# Patient Record
Sex: Male | Born: 1970 | Race: White | Hispanic: No | Marital: Married | Smoking: Never smoker
Health system: Southern US, Community
[De-identification: ages and names within clinical notes are randomized; demographics above are authoritative.]

## PROBLEM LIST (undated history)

## (undated) DIAGNOSIS — K5792 Diverticulitis of intestine, part unspecified, without perforation or abscess without bleeding: Secondary | ICD-10-CM

## (undated) DIAGNOSIS — I10 Essential (primary) hypertension: Secondary | ICD-10-CM

## (undated) DIAGNOSIS — R55 Syncope and collapse: Secondary | ICD-10-CM

## (undated) DIAGNOSIS — J189 Pneumonia, unspecified organism: Secondary | ICD-10-CM

## (undated) HISTORY — PX: TOE SURGERY: SHX1073

## (undated) HISTORY — PX: NASAL SINUS SURGERY: SHX719

---

## 1999-02-04 ENCOUNTER — Ambulatory Visit: Admission: RE | Admit: 1999-02-04 | Discharge: 1999-02-04 | Payer: Self-pay | Admitting: Pulmonary Disease

## 1999-03-05 ENCOUNTER — Ambulatory Visit (HOSPITAL_COMMUNITY): Admission: RE | Admit: 1999-03-05 | Discharge: 1999-03-05 | Payer: Self-pay | Admitting: Pulmonary Disease

## 1999-03-05 ENCOUNTER — Encounter: Payer: Self-pay | Admitting: Pulmonary Disease

## 1999-06-10 ENCOUNTER — Other Ambulatory Visit: Admission: RE | Admit: 1999-06-10 | Discharge: 1999-06-10 | Payer: Self-pay | Admitting: *Deleted

## 1999-06-10 ENCOUNTER — Encounter (INDEPENDENT_AMBULATORY_CARE_PROVIDER_SITE_OTHER): Payer: Self-pay

## 2004-04-08 ENCOUNTER — Ambulatory Visit: Payer: Self-pay | Admitting: Family Medicine

## 2004-05-11 ENCOUNTER — Encounter: Admission: RE | Admit: 2004-05-11 | Discharge: 2004-05-11 | Payer: Self-pay | Admitting: Neurology

## 2004-06-17 ENCOUNTER — Ambulatory Visit: Payer: Self-pay | Admitting: Cardiology

## 2004-06-26 ENCOUNTER — Ambulatory Visit: Payer: Self-pay

## 2004-07-17 ENCOUNTER — Ambulatory Visit: Payer: Self-pay | Admitting: Family Medicine

## 2004-09-16 ENCOUNTER — Ambulatory Visit: Payer: Self-pay | Admitting: Family Medicine

## 2005-02-27 ENCOUNTER — Ambulatory Visit: Payer: Self-pay | Admitting: Family Medicine

## 2006-01-29 ENCOUNTER — Ambulatory Visit: Payer: Self-pay | Admitting: Family Medicine

## 2006-05-07 ENCOUNTER — Ambulatory Visit: Payer: Self-pay | Admitting: Family Medicine

## 2006-12-29 ENCOUNTER — Ambulatory Visit: Payer: Self-pay | Admitting: Family Medicine

## 2006-12-29 DIAGNOSIS — J069 Acute upper respiratory infection, unspecified: Secondary | ICD-10-CM | POA: Insufficient documentation

## 2009-04-09 ENCOUNTER — Telehealth: Payer: Self-pay | Admitting: Family Medicine

## 2009-04-09 ENCOUNTER — Ambulatory Visit: Payer: Self-pay | Admitting: Family Medicine

## 2009-04-09 DIAGNOSIS — M25579 Pain in unspecified ankle and joints of unspecified foot: Secondary | ICD-10-CM | POA: Insufficient documentation

## 2009-10-18 ENCOUNTER — Encounter (INDEPENDENT_AMBULATORY_CARE_PROVIDER_SITE_OTHER): Payer: Self-pay | Admitting: *Deleted

## 2010-02-21 ENCOUNTER — Emergency Department (HOSPITAL_COMMUNITY)
Admission: EM | Admit: 2010-02-21 | Discharge: 2010-02-21 | Payer: Self-pay | Source: Home / Self Care | Admitting: Family Medicine

## 2010-04-15 ENCOUNTER — Emergency Department (HOSPITAL_COMMUNITY)
Admission: EM | Admit: 2010-04-15 | Discharge: 2010-04-15 | Payer: Self-pay | Source: Home / Self Care | Admitting: Family Medicine

## 2010-04-15 LAB — POCT URINALYSIS DIPSTICK
Bilirubin Urine: NEGATIVE
Ketones, ur: NEGATIVE mg/dL
Nitrite: NEGATIVE
Protein, ur: NEGATIVE mg/dL
Specific Gravity, Urine: 1.02 (ref 1.005–1.030)
Urine Glucose, Fasting: NEGATIVE mg/dL
Urobilinogen, UA: 1 mg/dL (ref 0.0–1.0)
pH: 7 (ref 5.0–8.0)

## 2010-04-16 NOTE — Letter (Signed)
Summary: Nadara Eaton letter  [place] [facility]  [address] [place], [place] [address]   Phone: [phone]  Fax: [phone]       10/18/2009 MRN: [id number]  NAEEM QUILLIN [address] [place], [place]  [address]  Dear Mr. [facility] [place] and [place] announce the retirement of Arta Silence, M.D., from full-time practice at the [facility] office effective September 13, 2009 and his plans of returning part-time.  It is important to Dr. Hetty Ely and to our practice that you understand that [facility] has seven physicians in our office for your health care needs.  We will continue to offer the same exceptional care that you have today.    Dr. Hetty Ely has spoken to many of you about his plans for retirement and returning part-time in the fall.   We will continue to work with you through the transition to schedule appointments for you in the office and meet the high standards that [place] is committed to.   Again, it is with great pleasure that we share the news that Dr. Hetty Ely will return to [facility] in October of 2011 with a reduced schedule.    If you have any questions, or would like to request an appointment with one of our physicians, please call us at [phone] and press the option for Scheduling an appointment.  We take pleasure in providing you with excellent patient care and look forward to seeing you at your next office visit.  Our [facility] Physicians are:  Tillman Abide, M.D. Laurita Quint, M.D. Roxy Manns, M.D. Kerby Nora, M.D. Hannah Beat, M.D. Ruthe Mannan, M.D. We proudly welcomed Raechel Ache, M.D. and Eustaquio Boyden, M.D. to the practice in July/August 2011.  Sincerely,  [facility]

## 2010-04-16 NOTE — Letter (Signed)
Summary: Out of Work  [employer] at [facility]  [address] [place], [place] [address]   Phone: [phone]  Fax: [phone]    April 09, 2009   Employee:  PAVLE WILER    To Whom It May Concern:   For Medical reasons, please excuse the above named employee from work for the following dates:  Start:   04/09/2009  End:   04/11/2009  If you need additional information, please feel free to contact our office.         Sincerely,    Ruthe Mannan MD

## 2010-04-16 NOTE — Progress Notes (Signed)
Summary: pain both ankles  Phone Note Call from Patient Call back at [phone]   Caller: Patient Call For: Dr. Dayton Martes Summary of Call: Pt having sharp pain in both ankles for a while but works with [employer] and over weekend pain worsened to a 7 on a scale of 1 - 10. Pt needs to be seen and also needs note for work. Pt to see Dr. Dayton Martes this morning at 10:45am. Initial call taken by: Lewanda Rife LPN,  April 09, 2009 8:17 AM

## 2010-04-16 NOTE — Assessment & Plan Note (Signed)
Summary: PAIN BOTH ANKLES/RI   Vital Signs:  Patient profile:   40 year old male Weight:      196.50 pounds Temp:     97.8 degrees F oral Pulse rate:   76 / minute Pulse rhythm:   regular BP sitting:   130 / 86  (left arm) Cuff size:   regular  Vitals Entered By: Delilah Shan CMA Duncan Dull) (April 09, 2009 10:38 AM) CC: Pain in ankles.   History of Present Illness: 40 yo male with pain in bilateral ankles.  Started in November at work. Works as Presenter, broadcasting, on his feet a lot.  Notices that after a long day of work, both of his ankles start aching on the lateral side, sometimes so bad it hurts medially as well. No pain in his feet. No injury. No swelling or redness of joints. Feels over last several weeks, pain is getting worse, almost feels like they are unstable. Has not sprained either ankle in past.  Wears [place] shoes at work.    Current Medications (verified): 1)  Tussin .... Otc Prn 2)  Ibuprofen .... As Needed Fever 3)  Meloxicam 15 Mg Tabs (Meloxicam) .... One By Mouth Daily  Allergies (verified): No Known Drug Allergies  Review of Systems      See HPI General:  Denies fever. MS:  Complains of joint pain; denies joint redness, joint swelling, and loss of strength.  Physical Exam  General:  Well-developed,well-nourished,in no acute distress; alert,appropriate and cooperative throughout examination   Foot/Ankle Exam  Ankle Exam:    Right:    Inspection:  Normal    Palpation:  Normal    Stability:  unstable    Tenderness:  no    Swelling:  no    Erythema:  no    FROM    Left:    Inspection:  Normal    Palpation:  Normal    Stability:  unstable    Tenderness:  no    Swelling:  no    Erythema:  no    FROM   Impression & Recommendations:  Problem # 1:  ANKLE PAIN, BILATERAL (ICD-719.47) Assessment New Likely multifactorial, from working long hours on cement floors and streets.  Likely needs more supportive foot wear that can stablize  ankles.  Will refer to sports med for further work up and treatment.  Given Meloxicam for pain/inflammation. Orders: Sports Medicine (Sports Med)  Complete Medication List: 1)  Tussin  .... Otc prn 2)  Ibuprofen  .... As needed fever 3)  Meloxicam 15 Mg Tabs (Meloxicam) .... One by mouth daily  Patient Instructions: 1)  Great to see you. 2)  Try elevating your feet. 3)  I have sent Meloxicam into your pharmacy. 4)  Please stop by to see Shirlee Limerick on your way out to set up your referral. Prescriptions: MELOXICAM 15 MG TABS (MELOXICAM) one by mouth daily  #30 x 0   Entered and Authorized by:   Ruthe Mannan MD   Signed by:   Ruthe Mannan MD on 04/09/2009   Method used:   Electronically to        [facility]  [address] (retail)       [address]       [place], [place]  [address]       Ph: [phone] or [phone]       Fax: [phone]   RxID:   [id number]   Prior Medications (reviewed today): None Current Allergies (reviewed today): No known  allergies

## 2010-04-17 LAB — URINE CULTURE
Colony Count: NO GROWTH
Culture  Setup Time: 201201310355
Culture: NO GROWTH

## 2011-04-20 ENCOUNTER — Emergency Department: Payer: Self-pay | Admitting: Emergency Medicine

## 2012-08-20 ENCOUNTER — Ambulatory Visit: Payer: Self-pay | Admitting: Podiatry

## 2012-08-23 LAB — PATHOLOGY REPORT

## 2013-03-17 DIAGNOSIS — J189 Pneumonia, unspecified organism: Secondary | ICD-10-CM

## 2013-03-17 HISTORY — DX: Pneumonia, unspecified organism: J18.9

## 2013-03-31 ENCOUNTER — Emergency Department (HOSPITAL_COMMUNITY): Payer: BC Managed Care – PPO

## 2013-03-31 ENCOUNTER — Observation Stay (HOSPITAL_COMMUNITY)
Admission: EM | Admit: 2013-03-31 | Discharge: 2013-04-01 | Disposition: A | Discharge status: Home / Self Care | Payer: BC Managed Care – PPO | Attending: Internal Medicine | Admitting: Internal Medicine

## 2013-03-31 ENCOUNTER — Observation Stay (HOSPITAL_COMMUNITY): Payer: BC Managed Care – PPO

## 2013-03-31 ENCOUNTER — Encounter (HOSPITAL_COMMUNITY): Payer: Self-pay | Admitting: Emergency Medicine

## 2013-03-31 DIAGNOSIS — R000 Tachycardia, unspecified: Secondary | ICD-10-CM

## 2013-03-31 DIAGNOSIS — J189 Pneumonia, unspecified organism: Principal | ICD-10-CM | POA: Insufficient documentation

## 2013-03-31 DIAGNOSIS — R9389 Abnormal findings on diagnostic imaging of other specified body structures: Secondary | ICD-10-CM

## 2013-03-31 DIAGNOSIS — I10 Essential (primary) hypertension: Secondary | ICD-10-CM | POA: Insufficient documentation

## 2013-03-31 DIAGNOSIS — I498 Other specified cardiac arrhythmias: Secondary | ICD-10-CM | POA: Insufficient documentation

## 2013-03-31 DIAGNOSIS — J069 Acute upper respiratory infection, unspecified: Secondary | ICD-10-CM

## 2013-03-31 DIAGNOSIS — R918 Other nonspecific abnormal finding of lung field: Secondary | ICD-10-CM | POA: Insufficient documentation

## 2013-03-31 DIAGNOSIS — M25579 Pain in unspecified ankle and joints of unspecified foot: Secondary | ICD-10-CM

## 2013-03-31 HISTORY — DX: Diverticulitis of intestine, part unspecified, without perforation or abscess without bleeding: K57.92

## 2013-03-31 HISTORY — DX: Essential (primary) hypertension: I10

## 2013-03-31 LAB — CBC WITH DIFFERENTIAL/PLATELET
BASOS ABS: 0 10*3/uL (ref 0.0–0.1)
Basophils Relative: 0 % (ref 0–1)
EOS PCT: 1 % (ref 0–5)
Eosinophils Absolute: 0.1 10*3/uL (ref 0.0–0.7)
HCT: 49 % (ref 39.0–52.0)
Hemoglobin: 16.7 g/dL (ref 13.0–17.0)
LYMPHS PCT: 24 % (ref 12–46)
Lymphs Abs: 1.8 10*3/uL (ref 0.7–4.0)
MCH: 31 pg (ref 26.0–34.0)
MCHC: 34.1 g/dL (ref 30.0–36.0)
MCV: 90.9 fL (ref 78.0–100.0)
Monocytes Absolute: 0.6 10*3/uL (ref 0.1–1.0)
Monocytes Relative: 8 % (ref 3–12)
NEUTROS ABS: 4.9 10*3/uL (ref 1.7–7.7)
NEUTROS PCT: 66 % (ref 43–77)
PLATELETS: 404 10*3/uL — AB (ref 150–400)
RBC: 5.39 MIL/uL (ref 4.22–5.81)
RDW: 13.4 % (ref 11.5–15.5)
WBC: 7.4 10*3/uL (ref 4.0–10.5)

## 2013-03-31 LAB — CBC
HCT: 47.7 % (ref 39.0–52.0)
Hemoglobin: 16.2 g/dL (ref 13.0–17.0)
MCH: 31.2 pg (ref 26.0–34.0)
MCHC: 34 g/dL (ref 30.0–36.0)
MCV: 91.9 fL (ref 78.0–100.0)
PLATELETS: 406 10*3/uL — AB (ref 150–400)
RBC: 5.19 MIL/uL (ref 4.22–5.81)
RDW: 13.5 % (ref 11.5–15.5)
WBC: 7.6 10*3/uL (ref 4.0–10.5)

## 2013-03-31 LAB — URINALYSIS, ROUTINE W REFLEX MICROSCOPIC
Bilirubin Urine: NEGATIVE
Glucose, UA: NEGATIVE mg/dL
Hgb urine dipstick: NEGATIVE
KETONES UR: NEGATIVE mg/dL
Leukocytes, UA: NEGATIVE
NITRITE: NEGATIVE
Protein, ur: NEGATIVE mg/dL
Specific Gravity, Urine: 1.01 (ref 1.005–1.030)
Urobilinogen, UA: 0.2 mg/dL (ref 0.0–1.0)
pH: 7 (ref 5.0–8.0)

## 2013-03-31 LAB — CREATININE, SERUM
Creatinine, Ser: 0.86 mg/dL (ref 0.50–1.35)
GFR calc non Af Amer: >90 mL/min (ref >90)

## 2013-03-31 LAB — COMPREHENSIVE METABOLIC PANEL
ALBUMIN: 3.9 g/dL (ref 3.5–5.2)
ALK PHOS: 183 U/L — AB (ref 39–117)
ALT: 25 U/L (ref 0–53)
AST: 17 U/L (ref 0–37)
BILIRUBIN TOTAL: 0.6 mg/dL (ref 0.3–1.2)
BUN: 10 mg/dL (ref 6–23)
CHLORIDE: 96 meq/L (ref 96–112)
CO2: 27 meq/L (ref 19–32)
CREATININE: 0.77 mg/dL (ref 0.50–1.35)
Calcium: 9.9 mg/dL (ref 8.4–10.5)
GFR calc Af Amer: >90 mL/min (ref >90)
GFR calc non Af Amer: >90 mL/min (ref >90)
Glucose, Bld: 119 mg/dL — ABNORMAL HIGH (ref 70–99)
Potassium: 4.3 mEq/L (ref 3.7–5.3)
Sodium: 137 mEq/L (ref 137–147)
Total Protein: 8.1 g/dL (ref 6.0–8.3)

## 2013-03-31 LAB — INFLUENZA PANEL BY PCR (TYPE A & B)
H1N1FLUPCR: NOT DETECTED
INFLBPCR: NEGATIVE
Influenza A By PCR: NEGATIVE

## 2013-03-31 LAB — TROPONIN I
Troponin I: <0.3 ng/mL (ref <0.30)
Troponin I: <0.3 ng/mL (ref <0.30)

## 2013-03-31 LAB — STREP PNEUMONIAE URINARY ANTIGEN: Strep Pneumo Urinary Antigen: NEGATIVE

## 2013-03-31 LAB — D-DIMER, QUANTITATIVE (NOT AT ARMC): D-Dimer, Quant: 0.46 ug/mL-FEU (ref 0.00–0.48)

## 2013-03-31 MED ORDER — MENTHOL 3 MG MT LOZG
1.0000 | LOZENGE | OROMUCOSAL | Status: DC | PRN
  Administered 2013-03-31: 3 mg via ORAL
  Filled 2013-03-31: qty 9

## 2013-03-31 MED ORDER — SODIUM CHLORIDE 0.9 % IV SOLN
INTRAVENOUS | Status: DC
  Administered 2013-03-31: 16:00:00 via INTRAVENOUS

## 2013-03-31 MED ORDER — GUAIFENESIN-CODEINE 100-10 MG/5ML PO SOLN: 5.0000 mL | Freq: Four times a day (QID) | ORAL | Status: DC | PRN

## 2013-03-31 MED ORDER — ENOXAPARIN SODIUM 40 MG/0.4ML ~~LOC~~ SOLN
40.0000 mg | SUBCUTANEOUS | Status: DC
  Administered 2013-03-31: 40 mg via SUBCUTANEOUS
  Filled 2013-03-31 (×2): qty 0.4

## 2013-03-31 MED ORDER — IOHEXOL 300 MG/ML  SOLN
100.0000 mL | Freq: Once | INTRAMUSCULAR | Status: AC | PRN
  Administered 2013-03-31: 100 mL via INTRAVENOUS

## 2013-03-31 MED ORDER — CEFEPIME HCL 1 G IJ SOLR
1.0000 g | Freq: Three times a day (TID) | INTRAMUSCULAR | Status: DC
  Administered 2013-03-31 – 2013-04-01 (×3): 1 g via INTRAVENOUS
  Filled 2013-03-31 (×5): qty 1

## 2013-03-31 MED ORDER — SODIUM CHLORIDE 0.9 % IV SOLN: INTRAVENOUS | Status: DC

## 2013-03-31 MED ORDER — VANCOMYCIN HCL IN DEXTROSE 1-5 GM/200ML-% IV SOLN
1000.0000 mg | Freq: Three times a day (TID) | INTRAVENOUS | Status: DC
  Administered 2013-03-31 – 2013-04-01 (×2): 1000 mg via INTRAVENOUS
  Filled 2013-03-31 (×3): qty 200

## 2013-03-31 MED ORDER — SODIUM CHLORIDE 0.9 % IV BOLUS (SEPSIS)
1000.0000 mL | Freq: Once | INTRAVENOUS | Status: AC
Start: 2013-03-31 — End: 2013-03-31
  Administered 2013-03-31: 1000 mL via INTRAVENOUS

## 2013-03-31 MED ORDER — BENZONATATE 100 MG PO CAPS
100.0000 mg | ORAL_CAPSULE | Freq: Three times a day (TID) | ORAL | Status: DC
  Administered 2013-03-31: 100 mg via ORAL
  Filled 2013-03-31 (×4): qty 1

## 2013-03-31 NOTE — H&P (Signed)
History and Physical       Hospital Admission Note Date: 03/31/2013  Patient name: Duane Rodriguez Medical record number: [id number] Date of birth: 1971/01/14 Age: 43 y.o. Gender: male  PCP: Ria Bush, MD    Chief Complaint:  Palpitations  HPI:  Patient is a 43 year old male with past medical history of hypertension, diverticulitis in 11/14 presented to the ER due to palpitations and feeling very fatigued. History was obtained from the patient who reported that he had episode of acute diverticulitis in November 2014 and he was on antibiotics, that resolved. Subsequently, in the first week of January 2015, he had fevers with chills, cough with productive phlegm, myalgias and arthralgias. He was placed on Augmentin by his PCP and also received steroid shot. Patient has not received Flu shot this year. He also reports that flu test was not checked outpatient. He also states that he has not received any Tamiflu outpatient. Patient also had chest tightness and feeling of palpitation and fatigue. Patient got his pulse was 134 this morning, BP 128/92. For EDP, on standing the patient's heart rate was in 130s to 140s, sinus rhythm He denies any smoking history, IV drug use, any recent long-distance travels. Chest x-ray showed irregular, linear and somewhat spiculated opacity in the central right upper lung, possibly underlying infectious/inflammatory process scarring or neoplasm.  Review of Systems:  Constitutional: Denies fever, chills, diaphoresis, poor appetite and fatigue.  HEENT: Denies photophobia, eye pain, redness, hearing loss, ear pain, congestion, sore throat, rhinorrhea, sneezing, mouth sores, trouble swallowing, neck pain, neck stiffness and tinnitus.   Respiratory please see history of present illness Cardiovascular: + Chest pain, palpitations. Denies leg swelling.  Gastrointestinal: Denies nausea, vomiting, abdominal pain,  diarrhea, constipation, blood in stool and abdominal distention.  Genitourinary: Denies dysuria, urgency, frequency, hematuria, flank pain and difficulty urinating.  Musculoskeletal: Denies myalgias, back pain, joint swelling, arthralgias and gait problem.  Skin: Denies pallor, rash and wound.  Neurological: Feeling fatigued and generalized weakness, Denies dizziness, seizures, syncope, weakness, light-headedness, numbness and headaches.  Hematological: Denies adenopathy. Easy bruising, personal or family bleeding history  Psychiatric/Behavioral: Denies suicidal ideation, mood changes, confusion, nervousness, sleep disturbance and agitation  Past Medical History: Past Medical History  Diagnosis Date  . Hypertension   . Diverticulitis    Past Surgical History  Procedure Laterality Date  . Nasal sinus surgery    . Toe surgery      Medications: Prior to Admission medications   Medication Sig Start Date End Date Taking? Authorizing Provider  losartan-hydrochlorothiazide (HYZAAR) 50-12.5 MG per tablet Take 1 tablet by mouth daily.   Yes Historical Provider, MD    Allergies:   Allergies  Allergen Reactions  . Ibuprofen Other (See Comments)    Oral ulcers    Social History:  reports that he has never smoked. He has never used smokeless tobacco. He reports that he drinks alcohol. He reports that he does not use illicit drugs.  Family History: History reviewed. No pertinent family history.  Physical Exam: Blood pressure 124/97, pulse 75, temperature 97.5 F (36.4 C), temperature source Oral, resp. rate 19, SpO2 93.00%. General: Alert, awake, oriented x3, in no acute distress. HEENT: normocephalic, atraumatic, anicteric sclera, pink conjunctiva, pupils equal and reactive to light and accomodation, oropharynx clear Neck: supple, no masses or lymphadenopathy, no goiter, no bruits  Heart: Regular rate and rhythm, without murmurs, rubs or gallops. Lungs: Clear to auscultation  bilaterally, no wheezing, rales or rhonchi. Abdomen: Soft, nontender, nondistended, positive bowel  sounds, no masses. Extremities: No clubbing, cyanosis or edema with positive pedal pulses. Neuro: Grossly intact, no focal neurological deficits, strength 5/5 upper and lower extremities bilaterally Psych: alert and oriented x 3, normal mood and affect Skin: no rashes or lesions, warm and dry   LABS on Admission:  Basic Metabolic Panel:  Recent Labs Lab 03/31/13 0940  NA 137  K 4.3  CL 96  CO2 27  GLUCOSE 119*  BUN 10  CREATININE 0.77  CALCIUM 9.9   Liver Function Tests:  Recent Labs Lab 03/31/13 0940  AST 17  ALT 25  ALKPHOS 183*  BILITOT 0.6  PROT 8.1  ALBUMIN 3.9   No results found for this basename: LIPASE, AMYLASE,  in the last 168 hours No results found for this basename: AMMONIA,  in the last 168 hours CBC:  Recent Labs Lab 03/31/13 0940  WBC 7.4  NEUTROABS 4.9  HGB 16.7  HCT 49.0  MCV 90.9  PLT 404*   Cardiac Enzymes:  Recent Labs Lab 03/31/13 0940  TROPONINI <0.30   BNP: No components found with this basename: POCBNP,  CBG: No results found for this basename: GLUCAP,  in the last 168 hours   Radiological Exams on Admission: Dg Chest 2 View  03/31/2013   CLINICAL DATA:  Tachycardia, hypertension  EXAM: CHEST  2 VIEW  COMPARISON:  None.  FINDINGS: Irregular linear but somewhat spiculated opacity in the right upper lung is best seen on the frontal view. The lungs are otherwise clear. The cardiac and mediastinal contours are within normal limits. No pneumothorax or pleural effusion. No acute osseous abnormality. .  IMPRESSION: Irregular, linear and somewhat spiculated opacity in the central right upper lung may reflect an underlying infectious/ inflammatory process, a focus of residual pleural parenchymal scarring, or potentially a neoplasm.  Recommend further evaluation with CT scan of the chest.   Electronically Signed   By: Jacqulynn Cadet M.D.    On: 03/31/2013 09:55    Assessment/Plan Principal Problem:   HCAP (healthcare-associated pneumonia) vs RUL opacity - Patient will be admitted for observation, will obtain CT chest with contrast for further workup of the spiculated opacity. Patient does not have any smoking history. - D-dimer is normal, will check flu PCR, urine legionella antigen, urine strep antigen - For now placed him on HCAP Protocol as he has done 2 courses of antibiotics in the last 2 months, continue IV fluid hydration. Narrow or dc antibiotics according to the CT chest results.  Active Problems:   Tachycardia with orthostasis, palpitations: Likely due to sinusitis and upper respiratory infection in the last 2 weeks - Will treat as above, continue IV fluid hydration, check orthostatic vitals in a.m. - EKG was reviewed, normal sinus rhythm. Patient will be admitted to telemetry to rule out any arrhythmias. - D-dimer is normal, check TSH, 2-D echocardiogram, serial troponins    Abnormal chest x-ray: As #1  Recent diverticulitis: Currently stable  DVT prophylaxis: Lovenox  CODE STATUS: Full code  Family Communication: Admission, patients condition and plan of care including tests being ordered have been discussed with the patient and his wife who indicates understanding and agree with the plan and Code Status   Further plan will depend as patient's clinical course evolves and further radiologic and laboratory data become available.   Time Spent on Admission: 1 hour  Isiaih Hollenbach M.D. [facility] 03/31/2013, 2:36 PM Pager: [phone]  If 7PM-7AM, please contact night-coverage [email or web address] Password [email or web address]

## 2013-03-31 NOTE — ED Provider Notes (Signed)
CSN: [id number]     Arrival date & time 03/31/13  0830 History   First MD Initiated Contact with Patient 03/31/13 [phone]     Chief Complaint  Patient presents with  . Tachycardia  . Hypertension   (Consider location/radiation/quality/duration/timing/severity/associated sxs/prior Treatment) HPI Comments: 43 yo wm with a PMH of diverticulitis in past cc of tachycardia and HTN.   Pt has been experiencing fast HR at work and his BP has been elevated.  Pt was seen on Tuesday by PCP and started on BP medicaiton (Hyzaar).  Pt has been taking Rx for 3 days. Pt was called by PCP and told his LFTs are elevated.    Pt also reports increased fatigue.  Pt believes he had the "flu" in first week of January.  He was on antibiotic, cough syrup, and steroids, and tylenol.    No recent travel.  No h/ o heart or lung disease.    No smoking, IDU.  Minimal caffeine.  No suppliments.  Age: 39 BP elevated Cholesterol unknown No DM Fit, not overweight Mother has HTN, but no heart disease reported  No travel, no hemoptysis, no h/o blood clots, no recent surgeries.    Patient is a 43 y.o. male presenting with hypertension. The history is provided by the patient and the spouse.  Hypertension This is a recurrent problem. The current episode started 3 to 5 hours ago. The problem occurs constantly. The problem has been gradually improving. Associated symptoms include chest pain. Pertinent negatives include no abdominal pain, no headaches and no shortness of breath. Associated symptoms comments: Chest pressure with palpitations. Nothing aggravates the symptoms. Nothing relieves the symptoms. He has tried nothing for the symptoms.    Past Medical History  Diagnosis Date  . Hypertension   . Diverticulitis    Past Surgical History  Procedure Laterality Date  . Nasal sinus surgery    . Toe surgery     Family History  Problem Relation Age of Onset  . Hypertension Mother   . Cancer - Lung Father    History   Substance Use Topics  . Smoking status: Never Smoker   . Smokeless tobacco: Never Used  . Alcohol Use: Yes     Comment: rarely    Review of Systems  Constitutional: Negative.   HENT: Negative.   Eyes: Negative.   Respiratory: Negative for cough, choking, chest tightness, shortness of breath, wheezing and stridor.   Cardiovascular: Positive for chest pain.  Gastrointestinal: Negative for nausea, abdominal pain, diarrhea, constipation and anal bleeding.  Endocrine: Negative.   Genitourinary: Negative.   Musculoskeletal: Negative.   Skin: Negative.   Allergic/Immunologic: Negative.   Neurological: Negative.  Negative for seizures, light-headedness, numbness and headaches.  All other systems reviewed and are negative.    Allergies  Ibuprofen  Home Medications   No current outpatient prescriptions on file. BP 115/91  Pulse 87  Temp(Src) 98.5 F (36.9 C) (Oral)  Resp 20  Ht 5\' 11"  (1.803 m)  Wt 205 lb (92.987 kg)  BMI 28.60 kg/m2  SpO2 98% Physical Exam  Constitutional: He is oriented to person, place, and time. He appears well-developed and well-nourished. No distress.  HENT:  Head: Normocephalic and atraumatic.  Nose: Nose normal.  Mouth/Throat: Oropharynx is clear and moist.  Eyes: Conjunctivae are normal. Right eye exhibits no discharge. Left eye exhibits no discharge.  Neck: Normal range of motion. Neck supple.  Cardiovascular: Regular rhythm and normal pulses.  Tachycardia present.  Exam reveals no distant heart  sounds and no friction rub.   No murmur heard. Pulmonary/Chest: Effort normal and breath sounds normal. No respiratory distress. He has no wheezes. He has no rales. He exhibits no tenderness.  Abdominal: Soft. Bowel sounds are normal. He exhibits no distension and no mass. There is no tenderness. There is no rebound and no guarding.  Musculoskeletal: Normal range of motion. He exhibits no edema and no tenderness.  Neurological: He is alert and oriented to  person, place, and time. He has normal reflexes.  Skin: Skin is warm and dry. He is not diaphoretic.    ED Course  Procedures (including critical care time) Labs Review Labs Reviewed  CBC WITH DIFFERENTIAL - Abnormal; Notable for the following:    Platelets 404 (*)    All other components within normal limits  COMPREHENSIVE METABOLIC PANEL - Abnormal; Notable for the following:    Glucose, Bld 119 (*)    Alkaline Phosphatase 183 (*)    All other components within normal limits  CULTURE, BLOOD (ROUTINE X 2)  CULTURE, BLOOD (ROUTINE X 2)  CULTURE, EXPECTORATED SPUTUM-ASSESSMENT  URINE CULTURE  TROPONIN I  D-DIMER, QUANTITATIVE  TROPONIN I  INFLUENZA PANEL BY PCR (TYPE A & B, H1N1)  TSH  HIV ANTIBODY (ROUTINE TESTING)  LEGIONELLA ANTIGEN, URINE  STREP PNEUMONIAE URINARY ANTIGEN  BASIC METABOLIC PANEL  CBC  CBC  CREATININE, SERUM  URINALYSIS, ROUTINE W REFLEX MICROSCOPIC  TROPONIN I  TROPONIN I  TROPONIN I  HEMOGLOBIN A1C   Imaging Review   EKG Interpretation    Date/Time:  Thursday March 31 2013 08:31:35 EST Ventricular Rate:  123 PR Interval:  148 QRS Duration: 98 QT Interval:  318 QTC Calculation: 455 R Axis:   -68 Text Interpretation:  Sinus tachycardia Possible Left atrial enlargement Left axis deviation Pulmonary disease pattern Abnormal ECG Confirmed by Chalon Zobrist  MD, Saniyah Mondesir ([id number]) on 03/31/2013 8:51:29 AM           Results for orders placed during the hospital encounter of 03/31/13  CBC WITH DIFFERENTIAL      Result Value Range   WBC 7.4  4.0 - 10.5 K/uL   RBC 5.39  4.22 - 5.81 MIL/uL   Hemoglobin 16.7  13.0 - 17.0 g/dL   HCT 49.0  39.0 - 52.0 %   MCV 90.9  78.0 - 100.0 fL   MCH 31.0  26.0 - 34.0 pg   MCHC 34.1  30.0 - 36.0 g/dL   RDW 13.4  11.5 - 15.5 %   Platelets 404 (*) 150 - 400 K/uL   Neutrophils Relative % 66  43 - 77 %   Neutro Abs 4.9  1.7 - 7.7 K/uL   Lymphocytes Relative 24  12 - 46 %   Lymphs Abs 1.8  0.7 - 4.0 K/uL   Monocytes  Relative 8  3 - 12 %   Monocytes Absolute 0.6  0.1 - 1.0 K/uL   Eosinophils Relative 1  0 - 5 %   Eosinophils Absolute 0.1  0.0 - 0.7 K/uL   Basophils Relative 0  0 - 1 %   Basophils Absolute 0.0  0.0 - 0.1 K/uL  COMPREHENSIVE METABOLIC PANEL      Result Value Range   Sodium 137  137 - 147 mEq/L   Potassium 4.3  3.7 - 5.3 mEq/L   Chloride 96  96 - 112 mEq/L   CO2 27  19 - 32 mEq/L   Glucose, Bld 119 (*) 70 - 99 mg/dL   BUN  10  6 - 23 mg/dL   Creatinine, Ser 7.820.77  0.50 - 1.35 mg/dL   Calcium 9.9  8.4 - 95.610.5 mg/dL   Total Protein 8.1  6.0 - 8.3 g/dL   Albumin 3.9  3.5 - 5.2 g/dL   AST 17  0 - 37 U/L   ALT 25  0 - 53 U/L   Alkaline Phosphatase 183 (*) 39 - 117 U/L   Total Bilirubin 0.6  0.3 - 1.2 mg/dL   GFR calc non Af Amer >90  >90 mL/min   GFR calc Af Amer >90  >90 mL/min  TROPONIN I      Result Value Range   Troponin I <0.30  <0.30 ng/mL  D-DIMER, QUANTITATIVE      Result Value Range   D-Dimer, Quant 0.46  0.00 - 0.48 ug/mL-FEU  TROPONIN I      Result Value Range   Troponin I <0.30  <0.30 ng/mL     MDM   1. Tachycardia   2. Abnormal chest x-ray   3. Acute upper respiratory infections of unspecified site   4. HCAP (healthcare-associated pneumonia)    43 year old white male presents emergency department with chief complaint of tachycardia and hypertension and chest tightness  ER course: Initial EKG reveals tachycardia 120s with nonspecific changes. Troponin negative. D-dimer negative. Chest x-ray concerning for a spiculated opacity in the right upper lung which may represent infectious inflammatory process however neoplasm is not out of the question.  Based on abnormal chest x-ray and persistent tachycardia on standing plan for consult to internal medicine for admission.  I spoke to Dr. Isidoro Donningai who will admit.  Second troponin pending.  The patient appears reasonably stabilized for admission considering the current resources, flow, and capabilities available in the ED at  this time, and I doubt any other [facility] further screening and/or treatment in the ED prior to admission. Patient and his wife agree with plan.     Darlys Galesavid Alitzel Cookson, MD 03/31/13 [phone]

## 2013-03-31 NOTE — ED Notes (Signed)
Pt reports to the ED for eval of tachycardia that he noted as soon as he awoke. Pt reports that his BP and HR have been running high lately. Pt reports that when he took his pulse this morning it was 134 bpm. Pt reports that he has been monitored for hypertension and this week he was placed on a new hypertensive medication. Pt reports his BP this morning was 128/92. Pts BP at this time 118/98 and HR 100s-110s. Heart beat regular and sinus tach. Pt reports some chest tightness this morning, heart fluttering, SOB, and lightheadedness. Denies any N/V, fevers, chills, diaphoresis, or dizziness. but denies any of these symptpms at this time. Pt also reports increased lethargy x several months ever since he had a exacerbation of his diverticulitis and the flu. Pt A&Ox4, resp e/u, and skin warm and dry.

## 2013-03-31 NOTE — Progress Notes (Signed)
ANTIBIOTIC CONSULT NOTE - INITIAL  Pharmacy Consult for vancomycin and cefepime Indication: Healthcare Associated Pneumonia  Allergies  Allergen Reactions  . Ibuprofen Other (See Comments)    Oral ulcers    Patient Measurements: Height: 5\' 11"  (180.3 cm) Weight: 205 lb (92.987 kg) IBW/kg (Calculated) : 75.3  Vital Signs: Temp: 97.5 F (36.4 C) (01/15 0831) Temp src: Oral (01/15 0831) BP: 110/93 mmHg (01/15 1447) Pulse Rate: 127 (01/15 1447) Intake/Output from previous day:   Intake/Output from this shift:    Labs:  Recent Labs  03/31/13 0940  WBC 7.4  HGB 16.7  PLT 404*  CREATININE 0.77   Estimated Creatinine Clearance: 138.8 ml/min (by C-G formula based on Cr of 0.77). No results found for this basename: VANCOTROUGH, VANCOPEAK, VANCORANDOM, GENTTROUGH, GENTPEAK, GENTRANDOM, TOBRATROUGH, TOBRAPEAK, TOBRARND, AMIKACINPEAK, AMIKACINTROU, AMIKACIN,  in the last 72 hours   Microbiology: No results found for this or any previous visit (from the past 720 hour(s)).  Medical History: Past Medical History  Diagnosis Date  . Hypertension   . Diverticulitis     Medications:  Unknown antibiotic 11/14 Augmentin first week of January  Assessment: Patient is a 43 year old male with past medical history of hypertension, diverticulitis in 11/14 presented to the ER due to palpitations and feeling very fatigued. CXR concerning for underlying infection or pneumonia. Given that patient has recently beein hospitalized and receiving antibiotics will start empiric coverage for HCAP. No fevers noted, all labs appear normal.   Goal of Therapy:  Vancomycin trough level 15-20 mcg/ml  Plan:  Measure antibiotic drug levels at steady state Follow up culture results Vancomycin 1g IV q 8 hours  Erin Hearing PharmD., BCPS Clinical Pharmacist Pager [phone] 03/31/2013 3:08 PM

## 2013-04-01 LAB — CBC
HCT: 47.6 % (ref 39.0–52.0)
HEMOGLOBIN: 16.1 g/dL (ref 13.0–17.0)
MCH: 31.2 pg (ref 26.0–34.0)
MCHC: 33.8 g/dL (ref 30.0–36.0)
MCV: 92.2 fL (ref 78.0–100.0)
Platelets: 397 10*3/uL (ref 150–400)
RBC: 5.16 MIL/uL (ref 4.22–5.81)
RDW: 13.5 % (ref 11.5–15.5)
WBC: 7.3 10*3/uL (ref 4.0–10.5)

## 2013-04-01 LAB — BASIC METABOLIC PANEL
BUN: 12 mg/dL (ref 6–23)
CO2: 27 mEq/L (ref 19–32)
Calcium: 9.2 mg/dL (ref 8.4–10.5)
Chloride: 98 mEq/L (ref 96–112)
Creatinine, Ser: 0.9 mg/dL (ref 0.50–1.35)
Glucose, Bld: 117 mg/dL — ABNORMAL HIGH (ref 70–99)
POTASSIUM: 4.3 meq/L (ref 3.7–5.3)
SODIUM: 138 meq/L (ref 137–147)

## 2013-04-01 LAB — HEMOGLOBIN A1C
Hgb A1c MFr Bld: 5.4 % (ref <5.7)
Mean Plasma Glucose: 108 mg/dL (ref <117)

## 2013-04-01 LAB — TSH: TSH: 2.156 u[IU]/mL (ref 0.350–4.500)

## 2013-04-01 LAB — HIV ANTIBODY (ROUTINE TESTING W REFLEX): HIV: NONREACTIVE

## 2013-04-01 LAB — TROPONIN I
Troponin I: <0.3 ng/mL (ref <0.30)
Troponin I: <0.3 ng/mL (ref <0.30)

## 2013-04-01 MED ORDER — LEVOFLOXACIN 750 MG PO TABS: 750.0000 mg | ORAL_TABLET | Freq: Every day | ORAL | Status: DC

## 2013-04-01 NOTE — Progress Notes (Signed)
Discussed discharge instructions and medications with pt. Pt showed no barriers to discharge. IV removed. Tele removed. Pt awaiting ride home with wife. Assessment unchanged from morning. Work note sent home with pt.

## 2013-04-01 NOTE — Discharge Summary (Signed)
Physician Discharge Summary  TIMMY DWINELL [id number] DOB: 11/30/1970 DOA: 03/31/2013  PCP: Ria Bush, MD  Admit date: 03/31/2013 Discharge date: 04/01/2013  Time spent: 45 minutes  Recommendations for Outpatient Follow-up:  -Will be discharged home today. -Advised to follow up with PCP in 2 weeks. -Will need a repeat CXR in 4-6 weeks to ensure clearance of his PNA.   Discharge Diagnoses:  Principal Problem:   CAP (community acquired pneumonia) Active Problems:   Tachycardia   Abnormal chest x-ray   Discharge Condition: Stable and improved  Filed Weights   03/31/13 1500 03/31/13 2044  Weight: 92.987 kg (205 lb) 92.987 kg (205 lb)    History of present illness:  Patient is a 43 year old male with past medical history of hypertension, diverticulitis in 11/14 presented to the ER due to palpitations and feeling very fatigued. History was obtained from the patient who reported that he had episode of acute diverticulitis in November 2014 and he was on antibiotics, that resolved. Subsequently, in the first week of January 2015, he had fevers with chills, cough with productive phlegm, myalgias and arthralgias. He was placed on Augmentin by his PCP and also received steroid shot. Patient has not received Flu shot this year. He also reports that flu test was not checked outpatient. He also states that he has not received any Tamiflu outpatient. Patient also had chest tightness and feeling of palpitation and fatigue. Patient got his pulse was 134 this morning, BP 128/92. For EDP, on standing the patient's heart rate was in 130s to 140s, sinus rhythm  He denies any smoking history, IV drug use, any recent long-distance travels.  Chest x-ray showed irregular, linear and somewhat spiculated opacity in the central right upper lung, possibly underlying infectious/inflammatory process scarring or neoplasm. Hospitalist admission was requested.   Hospital Course:   CAP -Feels much  better today. -Has no oxygen requirements. -Is not nauseous and feels he would have no issues taking PO antibiotics. -Will DC home on levaquin for 14 days. -Ct Chest performed given concerns for a ?mass on CXR; findings were most consistent with a multifocal infectious process.  Sinus Tachycardia -Improved. -Related to acute infection.  Procedures:  None   Consultations:  None  Discharge Instructions      Discharge Orders   Future Orders Complete By Expires   Diet - low sodium heart healthy  As directed    Discontinue IV  As directed    Increase activity slowly  As directed        Medication List         levofloxacin 750 MG tablet  Commonly known as:  LEVAQUIN  Take 1 tablet (750 mg total) by mouth daily. For 14 days     losartan-hydrochlorothiazide 50-12.5 MG per tablet  Commonly known as:  HYZAAR  Take 1 tablet by mouth daily.       Allergies  Allergen Reactions  . Ibuprofen Other (See Comments)    Oral ulcers   Follow-up Information   Follow up with Ria Bush, MD. Schedule an appointment as soon as possible for a visit in 2 weeks.   Specialty:  Family Medicine   Contact information:   [address] [place] [address] [phone]        The results of significant diagnostics from this hospitalization (including imaging, microbiology, ancillary and laboratory) are listed below for reference.    Significant Diagnostic Studies: Dg Chest 2 View  03/31/2013   CLINICAL DATA:  Tachycardia, hypertension  EXAM: CHEST  2 VIEW  COMPARISON:  None.  FINDINGS: Irregular linear but somewhat spiculated opacity in the right upper lung is best seen on the frontal view. The lungs are otherwise clear. The cardiac and mediastinal contours are within normal limits. No pneumothorax or pleural effusion. No acute osseous abnormality. .  IMPRESSION: Irregular, linear and somewhat spiculated opacity in the central right upper lung may reflect an underlying  infectious/ inflammatory process, a focus of residual pleural parenchymal scarring, or potentially a neoplasm.  Recommend further evaluation with CT scan of the chest.   Electronically Signed   By: Jacqulynn Cadet M.D.   On: 03/31/2013 09:55   Ct Chest W Contrast  03/31/2013   CLINICAL DATA:  Right upper lung abnormality on chest x-ray  EXAM: CT CHEST WITH CONTRAST  TECHNIQUE: Multidetector CT imaging of the chest was performed during intravenous contrast administration.  CONTRAST:  131mL OMNIPAQUE IOHEXOL 300 MG/ML  SOLN  COMPARISON:  Chest x-ray earlier today at 9:50 a.m.  FINDINGS: Mediastinum: Unremarkable CT appearance of the thyroid gland. No suspicious mediastinal or hilar adenopathy. No soft tissue mediastinal mass. The thoracic esophagus is unremarkable.  Heart/Vascular: Heart is within normal limits for size. No pericardial effusion. No a central filling defect to suggest acute PE. Normal-appearing aorta.  Lungs/Pleura: No pleural effusion. Focal consolidation in the right upper lung with adjacent micro and macronodular satellite opacities. The largest individual nodular opacity measures 12 mm. Additional numerous small nodular opacities are identified in a peribronchovascular distribution throughout the posterior left upper lobe and superior segment of the left lower lobe. There are some superimposed areas of tree-in-bud micro nodularity. No pleural effusion or pneumothorax.  Bones/Soft Tissues: No acute fracture or aggressive appearing lytic or blastic osseous lesion.  Upper Abdomen: Unremarkable imaged upper abdomen.  IMPRESSION: CT findings are most consistent with a multifocal infectious/inflammatory process involving the bilateral upper lobes and superior segment of the left lower lobe. Differential considerations should include atypical infections. Consider sputum cultures versus bronchoscopy. Recommend follow-up imaging to resolution following appropriate course of antimicrobial therapy.    Electronically Signed   By: Jacqulynn Cadet M.D.   On: 03/31/2013 16:01    Microbiology: No results found for this or any previous visit (from the past 240 hour(s)).   Labs: Basic Metabolic Panel:  Recent Labs Lab 03/31/13 0940 03/31/13 1930 04/01/13 0418  NA 137  --  138  K 4.3  --  4.3  CL 96  --  98  CO2 27  --  27  GLUCOSE 119*  --  117*  BUN 10  --  12  CREATININE 0.77 0.86 0.90  CALCIUM 9.9  --  9.2   Liver Function Tests:  Recent Labs Lab 03/31/13 0940  AST 17  ALT 25  ALKPHOS 183*  BILITOT 0.6  PROT 8.1  ALBUMIN 3.9   No results found for this basename: LIPASE, AMYLASE,  in the last 168 hours No results found for this basename: AMMONIA,  in the last 168 hours CBC:  Recent Labs Lab 03/31/13 0940 03/31/13 1930 04/01/13 0418  WBC 7.4 7.6 7.3  NEUTROABS 4.9  --   --   HGB 16.7 16.2 16.1  HCT 49.0 47.7 47.6  MCV 90.9 91.9 92.2  PLT 404* 406* 397   Cardiac Enzymes:  Recent Labs Lab 03/31/13 0940 03/31/13 1400 03/31/13 1930 03/31/13 2340 04/01/13 0418  TROPONINI <0.30 <0.30 <0.30 <0.30 <0.30   BNP: BNP (last 3 results) No results found for this  basename: PROBNP,  in the last 8760 hours CBG: No results found for this basename: GLUCAP,  in the last 168 hours     Signed:  Lelon Frohlich  [facility] Pager: [phone] 04/01/2013, 1:59 PM

## 2013-04-02 LAB — LEGIONELLA ANTIGEN, URINE: Legionella Antigen, Urine: NEGATIVE

## 2013-04-02 LAB — URINE CULTURE
Colony Count: NO GROWTH
Culture: NO GROWTH

## 2013-04-03 ENCOUNTER — Telehealth: Payer: Self-pay | Admitting: Family Medicine

## 2013-04-03 NOTE — Telephone Encounter (Signed)
Pt admitted for CAP 1/15-16/2015. plz call for transitional care phone call and schedule f/u appt within 2 wks. Discharged on levaquin 14d course.

## 2013-04-05 NOTE — Telephone Encounter (Signed)
Pt says he is no longer a patient of Dr. Danise Mina.  He sees Dr.Hedgerick in [place] and has made a follow up appt with him.  Info adjusted in pt's chart.

## 2013-04-07 LAB — CULTURE, BLOOD (ROUTINE X 2)
Culture: NO GROWTH
Culture: NO GROWTH

## 2013-04-14 ENCOUNTER — Ambulatory Visit: Payer: BC Managed Care – PPO | Admitting: Family Medicine

## 2014-07-07 NOTE — Op Note (Signed)
PATIENT NAME:  Duane Rodriguez, Duane Rodriguez MR#:  [id number] DATE OF BIRTH:  04-16-1970  DATE OF PROCEDURE:  08/20/2012  PREOPERATIVE DIAGNOSIS: Hallux limitus with exostosis right great toe joint.   POSTOPERATIVE DIAGNOSIS: Hallux limitus with exostosis right great toe joint.  PROCEDURE: Cheilectomy, right great toe joint.   SURGEON: Sharlotte Alamo, DPM.   ANESTHESIA: Local MAC.   HEMOSTASIS: Pneumatic tourniquet, right ankle, 250 mmHg.   ESTIMATED BLOOD LOSS: Minimal.   PATHOLOGY: Bone and cartilage right great toe joint.   MATERIALS: None.   COMPLICATIONS: None apparent.   OPERATIVE INDICATIONS: This is a 44 year old male with a chief complaint of pain in his right great toe joint that has been progressive over the last few years. Conservative care has proved unsuccessful and he elects for surgical cleaning up of the joint.   OPERATIVE PROCEDURE: The patient was taken to the Operating Room and placed on the table in the supine position. Following satisfactory sedation, the right foot was anesthetized with 10 mL of 0.5% Sensorcaine plain around the first metatarsal. A pneumatic tourniquet was applied at the level of the right ankle and the foot was prepped and draped in the usual sterile fashion. The foot was exsanguinated and the tourniquet inflated to 250 mmHg.   Attention was then directed to the dorsal aspect of the right great toe joint area where an approximately 4 cm linear incision was made, coursing proximal to distal, centered over the joint. The incision was deepened via sharp and blunt dissection with care taken to retract all neurovascular structures. At the level of the joint, a linear capsulotomy was performed and the capsular and periosteal tissues reflected off of the head of the first metatarsal and base of the proximal phalanx. There was noted to be significant degenerative spurring dorsally off the head of the first metatarsal as well as the base of the proximal phalanx. This was  removed using a pneumatic saw as well as a rongeur. After the spurs were removed, the joint was inspected and there was noted to be a significant defect along the lateral head of the first metatarsal approximately 35 to 40% of the joint surface. Also noted in the lateral aspect of the joint was a joint mouse calcification approximately 3 mm x 2 mm. This was removed. Next, using a wire pass drill, multiple holes were made in the exposed bone on the lateral surface of the head of the metatarsal as well as a couple of holes on the lateral aspect of the base of the proximal phalanx where there was also some eroded cartilage. There was noted to be better alignment with the toe in a more rectus and medial position. Attention was then directed to the medial aspect of the joint where a capsulorrhaphy was performed to tighten the medial aspect of the joint allowing the toe to sit in a better rectus alignment. A lateral capsulotomy intra-articular was also performed. Intraoperative FluoroScan views revealed good reduction of the bony spurs and alignment of the toe. The wound was flushed with copious amounts of sterile saline and closed using 3-0 Vicryl figure of eight sutures for capsulorrhaphy followed by 4-0 Vicryl running suture for all of the remaining layers from periosteal and capsular closure to deep and superficial and skin closure. Tincture of benzoin and Steri-Strips were applied followed by Xeroform and a sterile bandage. The tourniquet was released and blood flow noted to return immediately to the right foot and digits through five. The patient tolerated the procedure and anesthesia  well and was transported to the post anesthesia care unit with vital signs stable and in good condition.   ____________________________ Sharlotte Alamo, DPM tc:rw D: 08/20/2012 11:54:02 ET T: 08/20/2012 12:55:50 ET JOB#: [id number]  cc: Sharlotte Alamo, DPM, <Dictator> Shifa Brisbon DPM ELECTRONICALLY SIGNED 08/26/2012 10:40

## 2017-12-15 DIAGNOSIS — R55 Syncope and collapse: Secondary | ICD-10-CM

## 2017-12-15 HISTORY — DX: Syncope and collapse: R55

## 2018-01-13 ENCOUNTER — Other Ambulatory Visit: Payer: Self-pay | Admitting: Sports Medicine

## 2018-01-13 DIAGNOSIS — M25561 Pain in right knee: Principal | ICD-10-CM

## 2018-01-13 DIAGNOSIS — G8929 Other chronic pain: Secondary | ICD-10-CM

## 2018-01-31 ENCOUNTER — Ambulatory Visit
Admission: RE | Admit: 2018-01-31 | Discharge: 2018-01-31 | Disposition: A | Discharge status: Home / Self Care | Payer: BLUE CROSS/BLUE SHIELD | Source: Ambulatory Visit | Attending: Sports Medicine | Admitting: Sports Medicine

## 2018-01-31 DIAGNOSIS — S83241A Other tear of medial meniscus, current injury, right knee, initial encounter: Secondary | ICD-10-CM | POA: Insufficient documentation

## 2018-01-31 DIAGNOSIS — M25561 Pain in right knee: Secondary | ICD-10-CM

## 2018-01-31 DIAGNOSIS — G8929 Other chronic pain: Secondary | ICD-10-CM

## 2018-03-18 ENCOUNTER — Encounter
Admission: RE | Disposition: A | Discharge status: Home / Self Care | Payer: Self-pay | Source: Home / Self Care | Attending: Orthopedic Surgery

## 2018-03-18 ENCOUNTER — Ambulatory Visit
Admission: RE | Admit: 2018-03-18 | Discharge: 2018-03-18 | Disposition: A | Discharge status: Home / Self Care | Payer: BLUE CROSS/BLUE SHIELD | Attending: Orthopedic Surgery | Admitting: Orthopedic Surgery

## 2018-03-18 ENCOUNTER — Ambulatory Visit: Payer: BLUE CROSS/BLUE SHIELD | Admitting: Anesthesiology

## 2018-03-18 DIAGNOSIS — S83241A Other tear of medial meniscus, current injury, right knee, initial encounter: Secondary | ICD-10-CM | POA: Insufficient documentation

## 2018-03-18 DIAGNOSIS — X58XXXA Exposure to other specified factors, initial encounter: Secondary | ICD-10-CM | POA: Insufficient documentation

## 2018-03-18 DIAGNOSIS — Z79899 Other long term (current) drug therapy: Secondary | ICD-10-CM | POA: Insufficient documentation

## 2018-03-18 HISTORY — PX: KNEE ARTHROSCOPY WITH MEDIAL MENISECTOMY: SHX5651

## 2018-03-18 HISTORY — DX: Syncope and collapse: R55

## 2018-03-18 HISTORY — DX: Pneumonia, unspecified organism: J18.9

## 2018-03-18 SURGERY — ARTHROSCOPY, KNEE, WITH MEDIAL MENISCECTOMY
Anesthesia: General | Site: Knee | Laterality: Right

## 2018-03-18 MED ORDER — GLYCOPYRROLATE 0.2 MG/ML IJ SOLN
INTRAMUSCULAR | Status: DC | PRN
  Administered 2018-03-18: 0.1 mg via INTRAVENOUS

## 2018-03-18 MED ORDER — HYDROCODONE-ACETAMINOPHEN 7.5-325 MG PO TABS: 1.0000 | ORAL_TABLET | Freq: Once | ORAL | Status: DC | PRN

## 2018-03-18 MED ORDER — ONDANSETRON 4 MG PO TBDP: 4.0000 mg | ORAL_TABLET | Freq: Three times a day (TID) | ORAL | 0 refills | Status: AC | PRN

## 2018-03-18 MED ORDER — FENTANYL CITRATE (PF) 100 MCG/2ML IJ SOLN
INTRAMUSCULAR | Status: DC | PRN
  Administered 2018-03-18: 12.5 ug via INTRAVENOUS
  Administered 2018-03-18 (×2): 25 ug via INTRAVENOUS
  Administered 2018-03-18: 50 ug via INTRAVENOUS

## 2018-03-18 MED ORDER — ONDANSETRON HCL 4 MG/2ML IJ SOLN
INTRAMUSCULAR | Status: DC | PRN
  Administered 2018-03-18: 4 mg via INTRAVENOUS

## 2018-03-18 MED ORDER — HYDROMORPHONE HCL 1 MG/ML IJ SOLN
0.2500 mg | INTRAMUSCULAR | Status: DC | PRN
  Administered 2018-03-18 (×2): 0.25 mg via INTRAVENOUS

## 2018-03-18 MED ORDER — PROMETHAZINE HCL 25 MG/ML IJ SOLN: 12.5000 mg | Freq: Once | INTRAMUSCULAR | Status: DC | PRN

## 2018-03-18 MED ORDER — LIDOCAINE-EPINEPHRINE 1 %-1:100000 IJ SOLN
INTRAMUSCULAR | Status: DC | PRN
  Administered 2018-03-18: 4 mL via INTRAMUSCULAR

## 2018-03-18 MED ORDER — FENTANYL CITRATE (PF) 100 MCG/2ML IJ SOLN: 25.0000 ug | INTRAMUSCULAR | Status: DC | PRN

## 2018-03-18 MED ORDER — MIDAZOLAM HCL 5 MG/5ML IJ SOLN
INTRAMUSCULAR | Status: DC | PRN
  Administered 2018-03-18: 2 mg via INTRAVENOUS

## 2018-03-18 MED ORDER — LACTATED RINGERS IV SOLN
INTRAVENOUS | Status: DC
  Administered 2018-03-18: 12:00:00 via INTRAVENOUS

## 2018-03-18 MED ORDER — OXYCODONE HCL 5 MG PO TABS
5.0000 mg | ORAL_TABLET | Freq: Once | ORAL | Status: AC | PRN
  Administered 2018-03-18: 5 mg via ORAL

## 2018-03-18 MED ORDER — HYDROCODONE-ACETAMINOPHEN 5-325 MG PO TABS: 1.0000 | ORAL_TABLET | ORAL | 0 refills | Status: AC | PRN

## 2018-03-18 MED ORDER — LIDOCAINE HCL (CARDIAC) PF 100 MG/5ML IV SOSY
PREFILLED_SYRINGE | INTRAVENOUS | Status: DC | PRN
  Administered 2018-03-18: 40 mg via INTRATRACHEAL

## 2018-03-18 MED ORDER — DEXTROSE 5 % IV SOLN
2000.0000 mg | Freq: Once | INTRAVENOUS | Status: AC
  Administered 2018-03-18: 2000 mg via INTRAVENOUS

## 2018-03-18 MED ORDER — ASPIRIN EC 325 MG PO TBEC: 325.0000 mg | DELAYED_RELEASE_TABLET | Freq: Every day | ORAL | 0 refills | Status: AC

## 2018-03-18 MED ORDER — ACETAMINOPHEN 500 MG PO TABS: 1000.0000 mg | ORAL_TABLET | Freq: Three times a day (TID) | ORAL | 2 refills | Status: AC

## 2018-03-18 MED ORDER — PROPOFOL 10 MG/ML IV BOLUS
INTRAVENOUS | Status: DC | PRN
  Administered 2018-03-18: 150 mg via INTRAVENOUS
  Administered 2018-03-18: 50 mg via INTRAVENOUS

## 2018-03-18 MED ORDER — DEXAMETHASONE SODIUM PHOSPHATE 4 MG/ML IJ SOLN
INTRAMUSCULAR | Status: DC | PRN
  Administered 2018-03-18 (×2): 4 mg via INTRAVENOUS

## 2018-03-18 MED ORDER — OXYCODONE HCL 5 MG/5ML PO SOLN: 5.0000 mg | Freq: Once | ORAL | Status: AC | PRN

## 2018-03-18 SURGICAL SUPPLY — 44 items
ADAPTER IRRIG TUBE 2 SPIKE SOL (ADAPTER) ×4 IMPLANT
BLADE SURG SZ11 CARB STEEL (BLADE) ×2 IMPLANT
BNDG COHESIVE 4X5 TAN STRL (GAUZE/BANDAGES/DRESSINGS) ×2 IMPLANT
BNDG ESMARK 6X12 TAN STRL LF (GAUZE/BANDAGES/DRESSINGS) ×3 IMPLANT
BUR RADIUS 3.5 (BURR) ×1 IMPLANT
BUR RADIUS 4.0X18.5 (BURR) IMPLANT
CARTRIDGE SUT 2-0 NONSTITCH (Anchor) ×5 IMPLANT
CHLORAPREP W/TINT 26ML (MISCELLANEOUS) ×2 IMPLANT
COOLER POLAR GLACIER W/PUMP (MISCELLANEOUS) ×2 IMPLANT
COVER LIGHT HANDLE UNIVERSAL (MISCELLANEOUS) ×4 IMPLANT
CUFF TOURN SGL QUICK 30 (MISCELLANEOUS) ×1
CUFF TRNQT CYL LO 30X4X (MISCELLANEOUS) IMPLANT
DRAPE IMP U-DRAPE 54X76 (DRAPES) ×2 IMPLANT
DRAPE U-SHAPE 48X52 POLY STRL (PACKS) ×2 IMPLANT
GAUZE SPONGE 4X4 12PLY STRL (GAUZE/BANDAGES/DRESSINGS) ×2 IMPLANT
GLOVE BIO SURGEON STRL SZ7.5 (GLOVE) ×3 IMPLANT
GLOVE BIOGEL PI IND STRL 8 (GLOVE) ×1 IMPLANT
GLOVE BIOGEL PI INDICATOR 8 (GLOVE) ×2
GOWN STRL REUS W/ TWL LRG LVL3 (GOWN DISPOSABLE) ×1 IMPLANT
GOWN STRL REUS W/ TWL XL LVL3 (GOWN DISPOSABLE) ×1 IMPLANT
GOWN STRL REUS W/TWL LRG LVL3 (GOWN DISPOSABLE) ×1
GOWN STRL REUS W/TWL XL LVL3 (GOWN DISPOSABLE)
IV LACTATED RINGER IRRG 3000ML (IV SOLUTION) ×8
IV LR IRRIG 3000ML ARTHROMATIC (IV SOLUTION) ×4 IMPLANT
KIT TURNOVER KIT A (KITS) ×2 IMPLANT
MANAGER SUT NOVOCUT (CUTTER) ×1 IMPLANT
MAT ABSORB  FLUID 56X50 GRAY (MISCELLANEOUS) ×2
MAT ABSORB FLUID 56X50 GRAY (MISCELLANEOUS) ×1 IMPLANT
NEEDLE HYPO 22GX1.5 SAFETY (NEEDLE) ×2 IMPLANT
NEPTUNE MANIFOLD (MISCELLANEOUS) ×2 IMPLANT
NOVOSTICH PRO MENISCAL 2-0 (Miscellaneous) ×2 IMPLANT
NS IRRIG 500ML POUR BTL (IV SOLUTION) ×1 IMPLANT
PACK ARTHROSCOPY KNEE (MISCELLANEOUS) ×2 IMPLANT
PAD WRAPON POLAR KNEE (MISCELLANEOUS) ×1 IMPLANT
PADDING CAST BLEND 6X4 STRL (MISCELLANEOUS) ×1 IMPLANT
PADDING STRL CAST 6IN (MISCELLANEOUS) ×1
SET TUBE SUCT SHAVER OUTFL 24K (TUBING) ×2 IMPLANT
SET TUBE TIP INTRA-ARTICULAR (MISCELLANEOUS) ×2 IMPLANT
SUT ETHILON 3-0 FS-10 30 BLK (SUTURE) ×2
SUTURE EHLN 3-0 FS-10 30 BLK (SUTURE) ×1 IMPLANT
SYSTEM NVSTCH PRO MENISCAL 2-0 (Miscellaneous) IMPLANT
TUBING ARTHRO INFLOW-ONLY STRL (TUBING) ×2 IMPLANT
WAND HAND CNTRL MULTIVAC 90 (MISCELLANEOUS) IMPLANT
WRAPON POLAR PAD KNEE (MISCELLANEOUS) ×2

## 2018-03-18 NOTE — Anesthesia Preprocedure Evaluation (Addendum)
Anesthesia Evaluation  Patient identified by MRN, date of birth, ID band Patient awake    Airway Mallampati: I  TM Distance: >3 FB Neck ROM: Full    Dental   Pulmonary neg pulmonary ROS,    Pulmonary exam normal        Cardiovascular Normal cardiovascular exam     Neuro/Psych Recent syncopal episodes.  W/u negative. Thought to be orthostatic.    GI/Hepatic negative GI ROS,   Endo/Other    Renal/GU      Musculoskeletal   Abdominal   Peds  Hematology   Anesthesia Other Findings   Reproductive/Obstetrics                            Anesthesia Physical Anesthesia Plan  ASA: I  Anesthesia Plan: General   Post-op Pain Management:    Induction: Intravenous  PONV Risk Score and Plan: Ondansetron  Airway Management Planned: LMA  Additional Equipment:   Intra-op Plan:   Post-operative Plan: Extubation in OR  Informed Consent: I have reviewed the patients History and Physical, chart, labs and discussed the procedure including the risks, benefits and alternatives for the proposed anesthesia with the patient or authorized representative who has indicated his/her understanding and acceptance.     Plan Discussed with: CRNA  Anesthesia Plan Comments:        Anesthesia Quick Evaluation

## 2018-03-18 NOTE — Op Note (Signed)
Operative Note    SURGERY DATE: 03/18/2018   PRE-OP DIAGNOSIS:  1. Right medial meniscus tear   POST-OP DIAGNOSIS:  1. Right medial meniscus tear   PROCEDURES:  1. Right knee arthroscopy, medial meniscus repair   SURGEON: Cato Mulligan, MD   ANESTHESIA: Gen   ESTIMATED BLOOD LOSS: minimal   TOTAL IV FLUIDS: per anesthesia   INDICATION(S): Duane Rodriguez is a 48 y.o. male with clinical exam and MRI that were consistent with a medial meniscus tear. After discussion of risks, benefits, and alternatives to surgery, the patient elected to proceed.  The patient understands that there is a higher risk of re-tear with meniscus repair, but would prefer repair to maintain the normal biomechanics and structure of the knee. The patient is willing to perform the appropriate rehab and maintain weight-bearing restrictions post-operatively.   OPERATIVE FINDINGS:    Examination under anesthesia: A careful examination under anesthesia was performed.  Passive range of motion was: Hyperextension: 2.  Extension: 0.  Flexion: 140.  Lachman: normal. Pivot Shift: normal.  Posterior drawer: normal.  Varus stability in full extension: normal.  Varus stability in 30 degrees of flexion: normal.  Valgus stability in full extension: normal.  Valgus stability in 30 degrees of flexion: normal.   Intra-operative findings: A thorough arthroscopic examination of the knee was performed.  The findings are: 1. Suprapatellar pouch: Normal 2. Undersurface of median ridge: Grade 2 fissure 3. Medial patellar facet: Grade 1 softening 4. Lateral patellar facet: Grade 1 softening 5. Trochlea: Grade 1 changes 6. Lateral gutter/popliteus tendon: Normal 7. Hoffa's fat pad: Normal 8. Medial gutter/plica: Normal 9. ACL: Normal 10. PCL: Normal 11. Medial meniscus: Horizontal tear of the posterior horn affecting almost the entire width of the posterior horn 12. Medial compartment cartilage: Grade 1 degenerative changes to the  tibial plateau and medial femoral condyle 13. Lateral meniscus: Normal 14. Lateral compartment cartilage: Grade 2 degenerative changes to the tibial plateau, normal lateral femoral condyle   OPERATIVE REPORT:     I identified Duane Rodriguez in the pre-operative holding area.  I marked the operative knee with my initials. I reviewed the risks and benefits of the proposed surgical intervention, and the patient (and/or patient's guardian) wished to proceed. The patient was transferred to the operative suite and placed in the supine position with all bony prominences padded.  Anesthesia was administered. Appropriate IV antibiotics were administered within 30 minutes of incision. The extremity was then prepped and draped in standard fashion. A time out was performed confirming the correct extremity, correct patient, and correct procedure.   Arthroscopy portals were marked. Local anesthetic was injected to the planned portal sites. The anterolateral portal was established with an 11 blade. The arthroscope was placed in the anterolateral portal and then into the suprapatellar pouch.  Next the medial portal was established under needle localization. A spinal needle was used to pie-crust the MCL to allow for better visualization and protect the cartilage surfaces during instrumentation. The medial meniscus tear was identified. A diagnostic knee scope was completed with the above findings.   The edges of the meniscus tear and capsule were roughened with a rasp and shaver to create a more optimal healing surface.  Ceterix Novostitch x2 all-inside sutures were passed in a hay bale fashion to reduce the horizontal meniscus tear.  An additional 2 sutures were passed in a similar fashion after switching portals.  A total of 4 stitches were used.  Afterwards, the meniscus was probed  and felt to be stable. Microfracture of the intercondylar notch was then performed to allow for improved meniscus healing. Tourniquet was  released with time of 65 minutes. Arthroscopic fluid was removed from the joint.   The portals were closed with 3-0 Nylon suture. Sterile dressings included Xeroform, 4x4s, Sof-Rol, and Bias wrap. A Polarcare was placed. A T-scope hinged knee brace was applied.  The patient was then awakened and taken to the PACU hemodynamically stable without complication.     POSTOPERATIVE PLAN: The patient will be discharged home today once they meet PACU criteria. Aspirin 325 mg daily was prescribed for 2 weeks for DVT prophylaxis.  Physical therapy will start on POD#3-4. 50%WB x 4 weeks. F/U in 2 weeks.

## 2018-03-18 NOTE — Anesthesia Procedure Notes (Signed)
Procedure Name: LMA Insertion Date/Time: 03/18/2018 1:41 PM Performed by: Mayme Genta, CRNA Pre-anesthesia Checklist: Patient identified, Emergency Drugs available, Suction available, Timeout performed and Patient being monitored Patient Re-evaluated:Patient Re-evaluated prior to induction Oxygen Delivery Method: Circle system utilized Preoxygenation: Pre-oxygenation with 100% oxygen Induction Type: IV induction LMA: LMA inserted LMA Size: 5.0 Number of attempts: 3 Placement Confirmation: positive ETCO2 and breath sounds checked- equal and bilateral Tube secured with: Tape

## 2018-03-18 NOTE — H&P (Signed)
Paper H&P to be scanned into permanent record. H&P reviewed. No significant changes noted.  

## 2018-03-18 NOTE — Discharge Instructions (Signed)
Arthroscopic Knee Surgery - Meniscus Repair   Post-Op Instructions   1. Bracing or crutches: Crutches will be provided at the time of discharge from the surgery center. Keep brace locked in extension at all times except as directed by physical therapy.    2. Ice: You may be provided with a device [facility]) that allows you to ice the affected area effectively. Otherwise you can ice manually.    3. Driving:  Plan on not driving for at least four weeks. Please note that you are advised NOT to drive while taking narcotic pain medications as you may be impaired and unsafe to drive.   4. Activity: Ankle pumps several times an hour while awake to prevent blood clots. Weight bearing: 50% WEIGHT BEARING FOR 4 WEEKS. Use crutches for at least 6 weeks. Bending and straightening the knee is unlimited, but do not flex your knee past 90 degrees until cleared by your therapist. Elevate knee above heart level as much as possible for one week. Avoid standing more than 5 minutes (consecutively) for the first week. No exercise involving the knee until cleared by the surgeon or physical therapist.  Avoid long distance travel for 4 weeks.   5. Medications:  - You have been provided a prescription for narcotic pain medicine. After surgery, take 1-2 narcotic tablets every 4 hours if needed for severe pain. If it has tylenol (acetaminophen), please do not take a total of more than 3000mg /day of tylenol.  - A prescription for anti-nausea medication will be provided in case the narcotic medicine causes nausea - take 1 tablet every 6 hours only if nauseated.  - Take enteric coated aspirin 325 mg once daily for 4 weeks to prevent blood clots.  -Take tylenol 1000 every 8 hours for pain.  May stop tylenol 3 days after surgery or when you are having minimal pain. If your narcotic has tylenol (acetaminophen), please do not take a total of more than 3000mg /day of tylenol.    If you are taking prescription medication for  anxiety, depression, insomnia, muscle spasm, chronic pain, or for attention deficit disorder you are advised that you are at a higher risk of adverse effects with use of narcotics post-op, including narcotic addiction/dependence, depressed breathing, death. If you use non-prescribed substances: alcohol, marijuana, cocaine, heroin, methamphetamines, etc., you are at a higher risk of adverse effects with use of narcotics post-op, including narcotic addiction/dependence, depressed breathing, death. You are advised that taking > 50 morphine milligram equivalents (MME) of narcotic pain medication per day results in twice the risk of overdose or death. For your prescription provided: oxycodone 5 mg - taking more than 6 tablets per day. Be advised that we will prescribe narcotics short-term, for acute post-operative pain only - 1 week for minor operations such as knee arthroscopy for meniscus tear resection, and 3 weeks for major operations such as knee repair/reconstruction surgeries.   6. Bandages: The physical therapist should change the bandages at the first post-op appointment. If needed, the dressing supplies have been provided to you. You may shower after this with waterproof bandaids covering the incisions.    7. Physical Therapy: 2 times per week for the first 4 weeks, then 1-2 times per week from weeks 4-8 post-op. Therapy typically starts on post operative Day 3 or 4. You have been provided an order for physical therapy. The therapist will provide home exercises.   8. Work: May return to full work when off of crutches. May do light duty/desk job in  approximately 1-2 weeks when off of narcotics, pain is well-controlled, and swelling has decreased.   9. Post-Op Appointments: Your first post-op appointment will be with Dr. Posey Pronto in approximately 2 weeks time.    If you find that they have not been scheduled please call the Orthopaedic Appointment front desk at [phone].      General  Anesthesia, Adult, Care After This sheet gives you information about how to care for yourself after your procedure. Your health care provider may also give you more specific instructions. If you have problems or questions, contact your health care provider. What can I expect after the procedure? After the procedure, the following side effects are common:  Pain or discomfort at the IV site.  Nausea.  Vomiting.  Sore throat.  Trouble concentrating.  Feeling cold or chills.  Weak or tired.  Sleepiness and fatigue.  Soreness and body aches. These side effects can affect parts of the body that were not involved in surgery. Follow these instructions at home:  For at least 24 hours after the procedure:  Have a responsible adult stay with you. It is important to have someone help care for you until you are awake and alert.  Rest as needed.  Do not: ? Participate in activities in which you could fall or become injured. ? Drive. ? Use heavy machinery. ? Drink alcohol. ? Take sleeping pills or medicines that cause drowsiness. ? Make important decisions or sign legal documents. ? Take care of children on your own. Eating and drinking  Follow any instructions from your health care provider about eating or drinking restrictions.  When you feel hungry, start by eating small amounts of foods that are soft and easy to digest (bland), such as toast. Gradually return to your regular diet.  Drink enough fluid to keep your urine pale yellow.  If you vomit, rehydrate by drinking water, juice, or clear broth. General instructions  If you have sleep apnea, surgery and certain medicines can increase your risk for breathing problems. Follow instructions from your health care provider about wearing your sleep device: ? Anytime you are sleeping, including during daytime naps. ? While taking prescription pain medicines, sleeping medicines, or medicines that make you drowsy.  Return to your  normal activities as told by your health care provider. Ask your health care provider what activities are safe for you.  Take over-the-counter and prescription medicines only as told by your health care provider.  If you smoke, do not smoke without supervision.  Keep all follow-up visits as told by your health care provider. This is important. Contact a health care provider if:  You have nausea or vomiting that does not get better with medicine.  You cannot eat or drink without vomiting.  You have pain that does not get better with medicine.  You are unable to pass urine.  You develop a skin rash.  You have a fever.  You have redness around your IV site that gets worse. Get help right away if:  You have difficulty breathing.  You have chest pain.  You have blood in your urine or stool, or you vomit blood. Summary  After the procedure, it is common to have a sore throat or nausea. It is also common to feel tired.  Have a responsible adult stay with you for the first 24 hours after general anesthesia. It is important to have someone help care for you until you are awake and alert.  When you feel hungry, start by  eating small amounts of foods that are soft and easy to digest (bland), such as toast. Gradually return to your regular diet.  Drink enough fluid to keep your urine pale yellow.  Return to your normal activities as told by your health care provider. Ask your health care provider what activities are safe for you. This information is not intended to replace advice given to you by your health care provider. Make sure you discuss any questions you have with your health care provider. Document Released: 06/09/2000 Document Revised: 10/17/2016 Document Reviewed: 10/17/2016 Elsevier Interactive Patient Education  2019 [employer].

## 2018-03-18 NOTE — Transfer of Care (Signed)
Immediate Anesthesia Transfer of Care Note  Patient: Duane Rodriguez  Procedure(s) Performed: KNEE ARTHROSCOPY WITH MEDIAL MENISCAL REPAIR (Right Knee)  Patient Location: PACU  Anesthesia Type: General  Level of Consciousness: awake, alert  and patient cooperative  Airway and Oxygen Therapy: Patient Spontanous Breathing and Patient connected to supplemental oxygen  Post-op Assessment: Post-op Vital signs reviewed, Patient's Cardiovascular Status Stable, Respiratory Function Stable, Patent Airway and No signs of Nausea or vomiting  Post-op Vital Signs: Reviewed and stable  Complications: No apparent anesthesia complications

## 2018-03-18 NOTE — Anesthesia Postprocedure Evaluation (Signed)
Anesthesia Post Note  Patient: Duane Rodriguez  Procedure(s) Performed: KNEE ARTHROSCOPY WITH MEDIAL MENISCAL REPAIR (Right Knee)  Patient location during evaluation: PACU Anesthesia Type: General Level of consciousness: awake and alert Pain management: pain level controlled Vital Signs Assessment: post-procedure vital signs reviewed and stable Respiratory status: spontaneous breathing, nonlabored ventilation, respiratory function stable and patient connected to nasal cannula oxygen Cardiovascular status: blood pressure returned to baseline and stable Postop Assessment: no apparent nausea or vomiting Anesthetic complications: no    Trecia Rogers

## 2019-07-28 ENCOUNTER — Other Ambulatory Visit: Payer: Self-pay

## 2019-07-28 ENCOUNTER — Emergency Department (HOSPITAL_COMMUNITY)
Admission: EM | Admit: 2019-07-28 | Discharge: 2019-07-28 | Disposition: A | Discharge status: Home / Self Care | Payer: BC Managed Care – PPO | Attending: Emergency Medicine | Admitting: Emergency Medicine

## 2019-07-28 ENCOUNTER — Emergency Department (HOSPITAL_COMMUNITY): Payer: BC Managed Care – PPO

## 2019-07-28 ENCOUNTER — Encounter (HOSPITAL_COMMUNITY): Payer: Self-pay | Admitting: Pediatrics

## 2019-07-28 DIAGNOSIS — R0789 Other chest pain: Secondary | ICD-10-CM | POA: Diagnosis not present

## 2019-07-28 DIAGNOSIS — R079 Chest pain, unspecified: Secondary | ICD-10-CM

## 2019-07-28 LAB — BASIC METABOLIC PANEL
Anion gap: 7 (ref 5–15)
BUN: 17 mg/dL (ref 6–20)
CO2: 30 mmol/L (ref 22–32)
Calcium: 9.6 mg/dL (ref 8.9–10.3)
Chloride: 102 mmol/L (ref 98–111)
Creatinine, Ser: 1.25 mg/dL — ABNORMAL HIGH (ref 0.61–1.24)
GFR calc Af Amer: >60 mL/min (ref >60)
GFR calc non Af Amer: >60 mL/min (ref >60)
Glucose, Bld: 104 mg/dL — ABNORMAL HIGH (ref 70–99)
Potassium: 5 mmol/L (ref 3.5–5.1)
Sodium: 139 mmol/L (ref 135–145)

## 2019-07-28 LAB — CBC
HCT: 49.4 % (ref 39.0–52.0)
Hemoglobin: 16.4 g/dL (ref 13.0–17.0)
MCH: 30.4 pg (ref 26.0–34.0)
MCHC: 33.2 g/dL (ref 30.0–36.0)
MCV: 91.5 fL (ref 80.0–100.0)
Platelets: 294 10*3/uL (ref 150–400)
RBC: 5.4 MIL/uL (ref 4.22–5.81)
RDW: 12.3 % (ref 11.5–15.5)
WBC: 6.4 10*3/uL (ref 4.0–10.5)
nRBC: 0 % (ref 0.0–0.2)

## 2019-07-28 LAB — TROPONIN I (HIGH SENSITIVITY)
Troponin I (High Sensitivity): 3 ng/L (ref <18)
Troponin I (High Sensitivity): 4 ng/L (ref <18)

## 2019-07-28 MED ORDER — SODIUM CHLORIDE 0.9% FLUSH: 3.0000 mL | Freq: Once | INTRAVENOUS | Status: DC

## 2019-07-28 MED ORDER — ASPIRIN 81 MG PO CHEW
324.0000 mg | CHEWABLE_TABLET | Freq: Once | ORAL | Status: AC
  Administered 2019-07-28: 324 mg via ORAL
  Filled 2019-07-28: qty 4

## 2019-07-28 NOTE — ED Provider Notes (Signed)
[name] Provider Note   CSN: [id number] Arrival date & time: 07/28/19  0732     History Chief Complaint  Patient presents with  . Chest Pain    Duane Rodriguez is a 49 y.o. male.  HPI  HPI: A 48 year old patient presents for evaluation of chest pain. Initial onset of pain was approximately 3-6 hours ago. The patient's chest pain is sharp and is not worse with exertion. The patient's chest pain is middle- or left-sided, is not well-localized, is not described as heaviness/pressure/tightness and does not radiate to the arms/jaw/neck. The patient does not complain of nausea and denies diaphoresis. The patient has no history of stroke, has no history of peripheral artery disease, has not smoked in the past 90 days, denies any history of treated diabetes, has no relevant family history of coronary artery disease (first degree relative at less than age 81), is not hypertensive, has no history of hypercholesterolemia and does not have an elevated BMI (>=30).   Patient took tyleonl ithout teliePain is better without movement.  Worse with trunk movement.  First covid vaccine, phizer, 2 weeks, 5 days Dr. Kary Kos, [facility] Past Medical History:  Diagnosis Date  . Diverticulitis   . Hypertension    patient denies, BP was elevated when he had pneumonia  . Pneumonia 2015  . Syncope and collapse 12/2017   syncopal episode x 1, wore a holter monitor with no findings, no episodes since    Patient Active Problem List   Diagnosis Date Noted  . Tachycardia 03/31/2013  . CAP (community acquired pneumonia) 03/31/2013  . Abnormal chest x-Corrin Sieling 03/31/2013  . ANKLE PAIN, BILATERAL 04/09/2009  . URI 12/29/2006    Past Surgical History:  Procedure Laterality Date  . KNEE ARTHROSCOPY WITH MEDIAL MENISECTOMY Right 03/18/2018   Procedure: KNEE ARTHROSCOPY WITH MEDIAL MENISCAL REPAIR;  Surgeon: Leim Fabry, MD;  Location: [facility];  Service:  Orthopedics;  Laterality: Right;  . NASAL SINUS SURGERY    . TOE SURGERY         Family History  Problem Relation Age of Onset  . Hypertension Mother   . Cancer - Lung Father     Social History   Tobacco Use  . Smoking status: Never Smoker  . Smokeless tobacco: Never Used  Substance Use Topics  . Alcohol use: Yes    Comment: rarely  . Drug use: No    Home Medications Prior to Admission medications   Medication Sig Start Date End Date Taking? Authorizing Provider  HYDROcodone-acetaminophen (NORCO) 5-325 MG tablet Take 1-2 tablets by mouth every 4 (four) hours as needed for moderate pain or severe pain. 03/18/18   Leim Fabry, MD  loratadine (CLARITIN) 10 MG tablet Take 10 mg by mouth daily.    [provider]  losartan-hydrochlorothiazide (HYZAAR) 50-12.5 MG per tablet Take 1 tablet by mouth daily.    [provider]  ondansetron (ZOFRAN ODT) 4 MG disintegrating tablet Take 1 tablet (4 mg total) by mouth every 8 (eight) hours as needed for nausea or vomiting. 03/18/18   Leim Fabry, MD    Allergies    Ibuprofen  Review of Systems   Review of Systems  Constitutional: Negative.   HENT: Negative.   Eyes: Negative.   Respiratory: Negative.   Cardiovascular: Positive for chest pain.  Gastrointestinal: Negative.   Endocrine: Negative.   Genitourinary: Negative.   Musculoskeletal: Negative.   Skin: Negative.   Allergic/Immunologic: Positive for environmental allergies.  Neurological: Negative.   Hematological: Negative.   Psychiatric/Behavioral: Negative.   All other systems reviewed and are negative.   Physical Exam Updated Vital Signs BP 120/89 (BP Location: Left Arm)   Pulse 86   Temp 98.5 F (36.9 C) (Oral)   Resp 16   Ht 1.803 m (5\' 11" )   Wt 89.8 kg   SpO2 97%   BMI 27.62 kg/m   Physical Exam Vitals and nursing note reviewed.  Constitutional:      General: He is not in acute distress.    Appearance: He is well-developed and normal  weight.  HENT:     Head: Normocephalic and atraumatic.  Eyes:     Pupils: Pupils are equal, round, and reactive to light.  Cardiovascular:     Rate and Rhythm: Normal rate and regular rhythm.     Heart sounds: Normal heart sounds.  Pulmonary:     Effort: Pulmonary effort is normal.     Breath sounds: Normal breath sounds.  Abdominal:     General: Bowel sounds are normal.     Palpations: Abdomen is soft.  Musculoskeletal:        General: Normal range of motion.     Cervical back: Normal range of motion and neck supple.  Skin:    General: Skin is warm and dry.     Capillary Refill: Capillary refill takes less than 2 seconds.  Neurological:     General: No focal deficit present.     Mental Status: He is alert.     ED Results / Procedures / Treatments   Labs (all labs ordered are listed, but only abnormal results are displayed) Labs Reviewed  BASIC METABOLIC PANEL  CBC  TROPONIN I (HIGH SENSITIVITY)    EKG EKG Interpretation  Date/Time:  Thursday Jul 28 2019 07:29:58 EDT Ventricular Rate:  86 PR Interval:  162 QRS Duration: 108 QT Interval:  368 QTC Calculation: 440 R Axis:   113 Text Interpretation: Normal sinus rhythm with sinus arrhythmia Low voltage QRS Left posterior fascicular block Cannot rule out Anterior infarct , age undetermined Abnormal ECG Confirmed by Pattricia Boss [phone]) on 07/28/2019 7:52:52 AM   Radiology No results found.  Procedures Procedures (including critical care time)  Medications Ordered in ED Medications  sodium chloride flush (NS) 0.9 % injection 3 mL (has no administration in time range)    ED Course  I have reviewed the triage vital signs and the nursing notes.  Pertinent labs & imaging results that were available during my care of the patient were reviewed by me and considered in my medical decision making (see chart for details).    MDM Rules/Calculators/A&P HEAR Score: 2                    Patient seen and evaluated for  chest pain.  Differential diagnosis of serious/life threatening causes of chest pain includes ACS, other diseases of the heart such as myocarditis or pericarditis, lung etiologies such as infection or pneumothorax, diseases of the great vessels such as aortic dissection or AAA, pulmonary embolism, or GI sources such as cholecystitis or other upper abdominal causes. Doubt ACS- heart score documented, EKG reviewed, troponin and repeat troponin obtained and WNL Doubt myocarditis/pericarditis based on history, review of ekg and labs Doubt aortic dissection based on history and review of imaging Doubt intrinsic lung causes such as pneumonia or pneumothorax, based on history, physical exam, and studies obtained. Doubt PE based on history, physical exam, and PERC  Doubt acute GI etiology requiring intervention based on history, physical exam and labs. Patient appears stable for discharge. Return precautions and need for follow up discussed and patient voices understanding  Final Clinical Impression(s) / ED Diagnoses Final diagnoses:  Chest pain, unspecified type    Rx / DC Orders ED Discharge Orders    None       Pattricia Boss, MD 07/28/19 1113

## 2019-07-28 NOTE — ED Triage Notes (Signed)
C/O chest pain started at 0300; woke him up from sleep; denies prior medical hx. Stated he took tylenol w/ no relief.

## 2019-07-28 NOTE — Discharge Instructions (Addendum)
Please return if worsening symptoms Call cardiology for follow up

## 2019-11-08 IMAGING — MR MR KNEE*R* W/O CM
6 series · 40 of 40 positions shown · non-contrast
Comparison: None.

CLINICAL DATA: Chronic knee pain, worsening over the past year.

EXAM:
MRI OF THE RIGHT KNEE WITHOUT CONTRAST
TECHNIQUE: Multiplanar, multisequence MR imaging of the knee was performed. No
intravenous contrast was administered.

[Series 8: T2 fat-sat · axial · right · 4.0mm · 0.50mm/px · z∈[-48,+77]mm · 5 of 26 slices shown (1 of 3)]
[im 1/26]
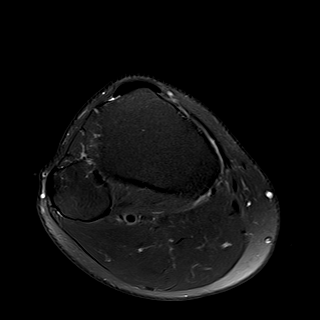
[im 7/26]
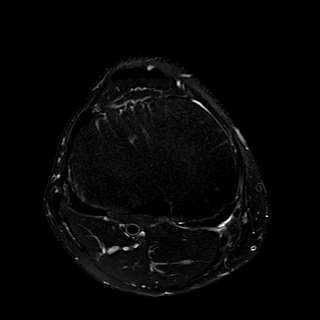
[im 13/26]
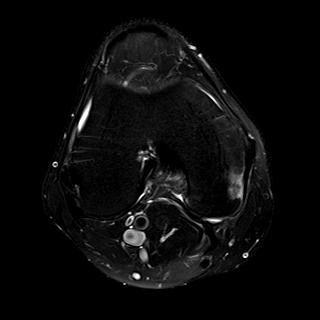
[im 19/26]
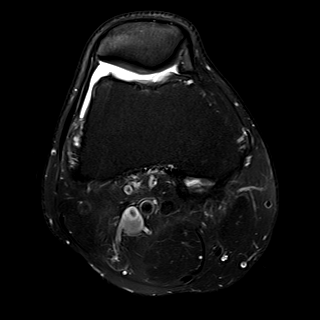
[im 26/26]
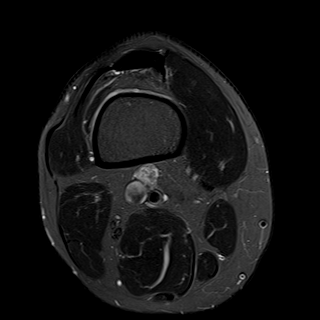

[Series 9: T1 · coronal · right · 4.0mm · 0.59mm/px · 7 of 30 slices shown]
[im 1/30]
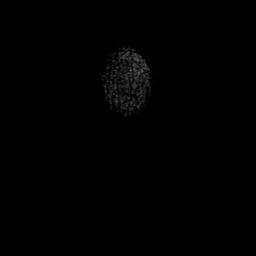
[im 5/30]
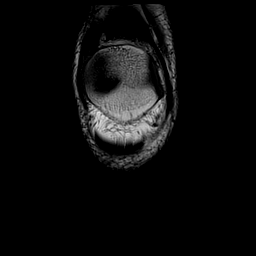
[im 10/30]
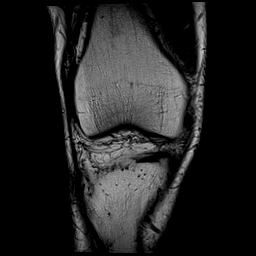
[im 15/30]
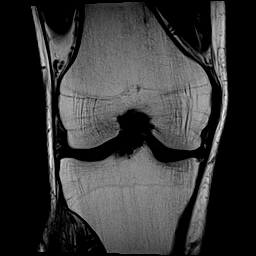
[im 20/30]
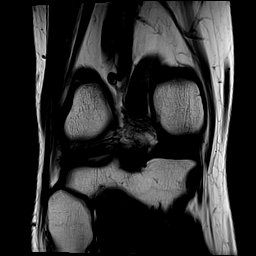
[im 25/30]
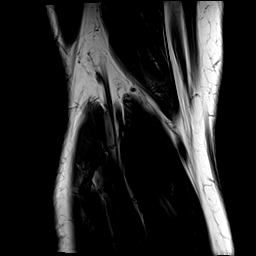
[im 30/30]
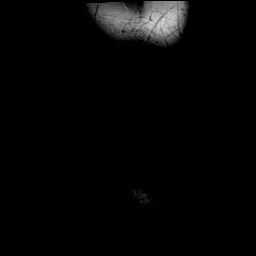

[Series 10: T2 fat-sat · coronal · right · 4.0mm · 0.59mm/px · 7 of 30 slices shown (2 of 3)]
[im 1/30]
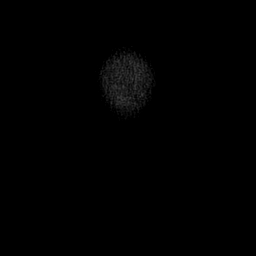
[im 5/30]
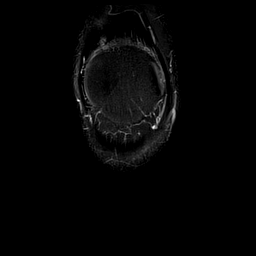
[im 10/30]
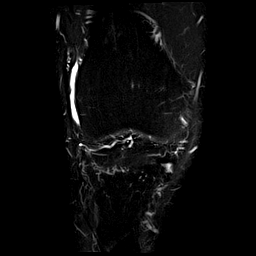
[im 15/30]
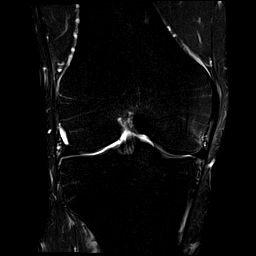
[im 20/30]
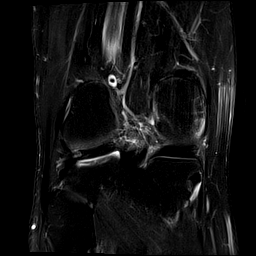
[im 25/30]
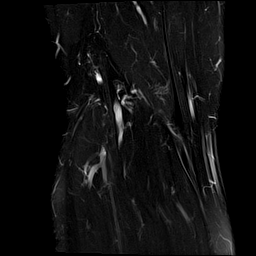
[im 30/30]
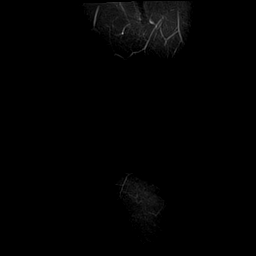

[Series 12: PD fat-sat · sagittal · right · 3.0mm · 0.62mm/px · 7 of 32 slices shown (1 of 2)]
[im 1/32]
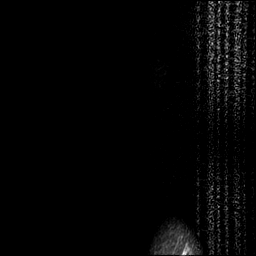
[im 6/32]
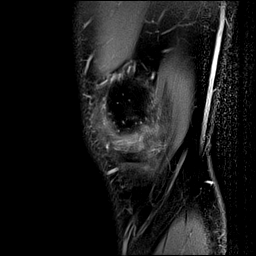
[im 11/32]
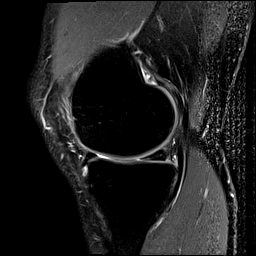
[im 16/32]
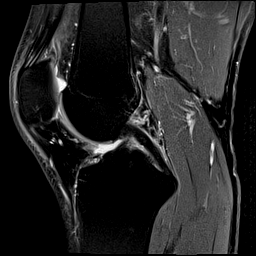
[im 21/32]
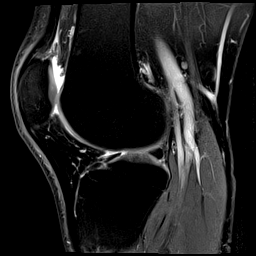
[im 26/32]
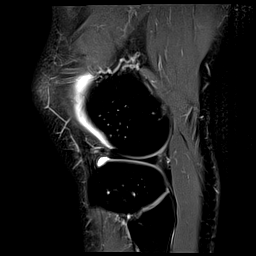
[im 32/32]
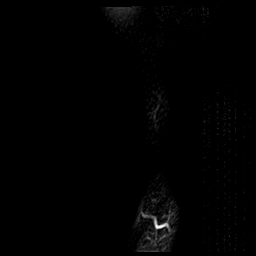

[Series 13: T2 fat-sat · sagittal · right · 3.0mm · 0.62mm/px · 7 of 32 slices shown (3 of 3)]
[im 1/32]
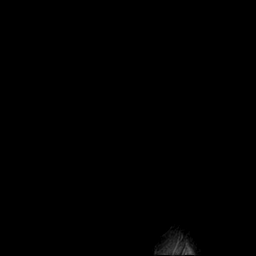
[im 6/32]
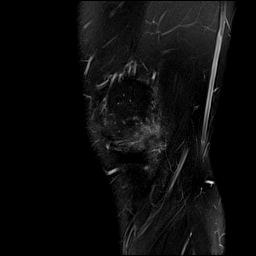
[im 11/32]
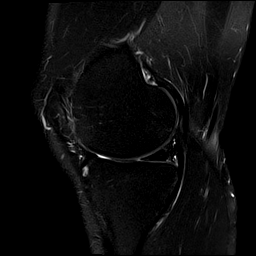
[im 16/32]
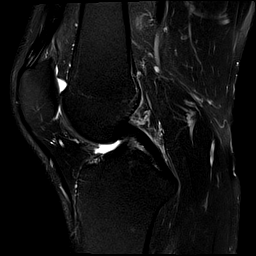
[im 21/32]
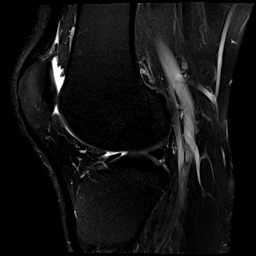
[im 26/32]
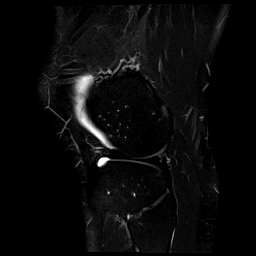
[im 32/32]
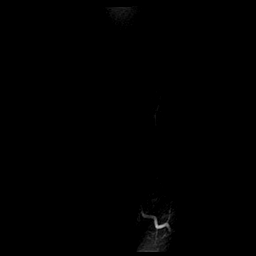

[Series 14: PD fat-sat · coronal · right · 4.0mm · 0.59mm/px · 7 of 30 slices shown (2 of 2)]
[im 1/30]
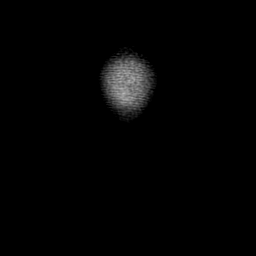
[im 5/30]
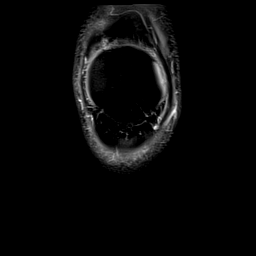
[im 10/30]
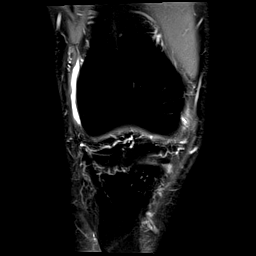
[im 15/30]
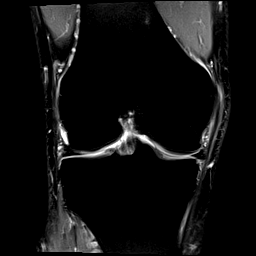
[im 20/30]
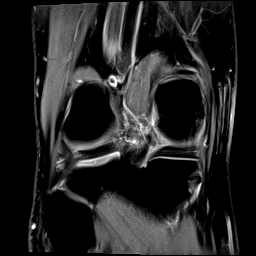
[im 25/30]
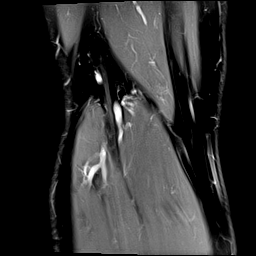
[im 30/30]
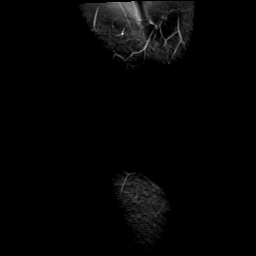

[40 of 40 positions shown; findings below may reference images not displayed]

FINDINGS: MENISCI

Medial meniscus:  Oblique tear of the posterior horn undersurface.

Lateral meniscus:  Intact.

LIGAMENTS

Cruciates:  Intact ACL and PCL.

Collaterals: Medial collateral ligament is intact. Lateral
collateral ligament complex is intact.

CARTILAGE

Patellofemoral:  No chondral defect.

Medial:  No chondral defect.

Lateral:  No chondral defect.

Joint: Trace joint effusion. Normal Hoffa's fat. No plical
thickening.

Popliteal Fossa:  No Baker cyst. Intact popliteus tendon.

Extensor Mechanism: Intact quadriceps tendon and patellar tendon.
Intact medial and lateral patellar retinaculum. Intact MPFL.

Bones: Peripheral edema in the posterior medial femoral condyle. No
acute fracture or dislocation. No focal bone lesion.

Other: Small amount of fluid in the semimembranosus bursa.
IMPRESSION: 1. Oblique tear of the medial meniscus posterior horn.
2. Nonspecific marrow edema in the peripheral posterior medial
femoral condyle, potentially a contusion. No fracture.
3. Very mild semimembranosus bursitis.

## 2020-06-11 ENCOUNTER — Ambulatory Visit (INDEPENDENT_AMBULATORY_CARE_PROVIDER_SITE_OTHER): Payer: BC Managed Care – PPO | Admitting: Dermatology

## 2020-06-11 ENCOUNTER — Other Ambulatory Visit: Payer: Self-pay

## 2020-06-11 DIAGNOSIS — L720 Epidermal cyst: Secondary | ICD-10-CM | POA: Diagnosis not present

## 2020-06-11 NOTE — Progress Notes (Unsigned)
   New Patient Visit  Subjective  Duane Rodriguez is a 50 y.o. male who presents for the following: Skin Problem (New pt presents with a growth on the R post neck x 2 years, growing and tender to the touch).  The following portions of the chart were reviewed this encounter and updated as appropriate:   Tobacco  Allergies  Meds  Problems  Med Hx  Surg Hx  Fam Hx     Review of Systems:  No other skin or systemic complaints except as noted in HPI or Assessment and Plan.  Objective  Well appearing patient in no apparent distress; mood and affect are within normal limits.  A focused examination was performed including face,neck . Relevant physical exam findings are noted in the Assessment and Plan.  Objective  right posterior neck: 1.2 cm Subcutaneous papule/nodule with erythema and edema, tender to touch.    Assessment & Plan  Epidermal cyst right posterior neck  Symptomatic   Recommend surgery removal   Return for cyst surgery .  IMarye Round, CMA, am acting as scribe for Sarina Ser, MD .  Documentation: I have reviewed the above documentation for accuracy and completeness, and I agree with the above.  Sarina Ser, MD

## 2020-06-11 NOTE — Patient Instructions (Addendum)
   Pre-Operative Instructions  You are scheduled for a surgical procedure at [facility]. We recommend you read the following instructions. If you have any questions or concerns, please call the office at [phone].  1. Shower and wash the entire body with soap and water the day of your surgery paying special attention to cleansing at and around the planned surgery site.  2. Avoid aspirin or aspirin containing products at least fourteen (14) days prior to your surgical procedure and for at least one week (7 Days) after your surgical procedure. If you take aspirin on a regular basis for heart disease or history of stroke or for any other reason, we may recommend you continue taking aspirin but please notify us if you take this on a regular basis. Aspirin can cause more bleeding to occur during surgery as well as prolonged bleeding and bruising after surgery.   3. Avoid other nonsteroidal pain medications at least one week prior to surgery and at least one week prior to your surgery. These include medications such as Ibuprofen (Motrin, Advil and Nuprin), Naprosyn, Voltaren, Relafen, etc. If medications are used for therapeutic reasons, please inform us as they can cause increased bleeding or prolonged bleeding during and bruising after surgical procedures.   4. Please advise us if you are taking any "blood thinner" medications such as Coumadin or Dipyridamole or Plavix or similar medications. These cause increased bleeding and prolonged bleeding during procedures and bruising after surgical procedures. We may have to consider discontinuing these medications briefly prior to and shortly after your surgery if safe to do so.   5. Please inform us of all medications you are currently taking. All medications that are taken regularly should be taken the day of surgery as you always do. Nevertheless, we need to be informed of what medications you are taking prior to surgery to know whether they will  affect the procedure or cause any complications.   6. Please inform us of any medication allergies. Also inform us of whether you have allergies to Latex or rubber products or whether you have had any adverse reaction to Lidocaine or Epinephrine.  7. Please inform us of any prosthetic or artificial body parts such as artificial heart valve, joint replacements, etc., or similar condition that might require preoperative antibiotics.   8. We recommend avoidance of alcohol at least two weeks prior to surgery and continued avoidance for at least two weeks after surgery.   9. We recommend discontinuation of tobacco smoking at least two weeks prior to surgery and continued abstinence for at least two weeks after surgery.  10. Do not plan strenuous exercise, strenuous work or strenuous lifting for approximately four weeks after your surgery.   11. We request if you are unable to make your scheduled surgical appointment, please call us at least a week in advance or as soon as you are aware of a problem so that we can cancel or reschedule the appointment.   12. You MAY TAKE TYLENOL (acetaminophen) for pain as it is not a blood thinner.   13. PLEASE PLAN TO BE IN TOWN FOR TWO WEEKS FOLLOWING SURGERY, THIS IS IMPORTANT SO YOU CAN BE CHECKED FOR DRESSING CHANGES, SUTURE REMOVAL AND TO MONITOR FOR POSSIBLE COMPLICATIONS.  14.  

## 2020-06-13 ENCOUNTER — Encounter: Payer: Self-pay | Admitting: Dermatology

## 2020-07-24 ENCOUNTER — Ambulatory Visit (INDEPENDENT_AMBULATORY_CARE_PROVIDER_SITE_OTHER): Payer: BC Managed Care – PPO | Admitting: Dermatology

## 2020-07-24 ENCOUNTER — Other Ambulatory Visit: Payer: Self-pay

## 2020-07-24 ENCOUNTER — Telehealth: Payer: Self-pay

## 2020-07-24 DIAGNOSIS — D492 Neoplasm of unspecified behavior of bone, soft tissue, and skin: Secondary | ICD-10-CM

## 2020-07-24 DIAGNOSIS — L720 Epidermal cyst: Secondary | ICD-10-CM

## 2020-07-24 MED ORDER — MUPIROCIN 2 % EX OINT: 1.0000 "application " | TOPICAL_OINTMENT | Freq: Every day | CUTANEOUS | 1 refills | Status: DC

## 2020-07-24 NOTE — Telephone Encounter (Signed)
Patient having a little bit of pain after today's surgery.  Advised he could take 2 extra strength tylenol.  Advised pt to call office if any problems after today's surgery.Duane Rodriguez

## 2020-07-24 NOTE — Patient Instructions (Signed)
If you have any questions or concerns for your doctor, please call our main line at [phone] and press option 4 to reach your doctor's medical assistant. If no one answers, please leave a voicemail as directed and we will return your call as soon as possible. Messages left after 4 pm will be answered the following business day.   You may also send us a message via MyChart. We typically respond to MyChart messages within 1-2 business days.  For prescription refills, please ask your pharmacy to contact our office. Our fax number is [phone].  If you have an urgent issue when the clinic is closed that cannot wait until the next business day, you can page your doctor at the number below.    Please note that while we do our best to be available for urgent issues outside of office hours, we are not available 24/7.   If you have an urgent issue and are unable to reach us, you may choose to seek medical care at your doctor's office, retail clinic, urgent care center, or emergency room.  If you have a medical emergency, please immediately call 911 or go to the emergency department.  Pager Numbers  - Dr. Kowalski: [phone]  - Dr. Moye: [phone]  - Dr. Stewart: [phone]  In the event of inclement weather, please call our main line at [phone] for an update on the status of any delays or closures.  Dermatology Medication Tips: Please keep the boxes that topical medications come in in order to help keep track of the instructions about where and how to use these. Pharmacies typically print the medication instructions only on the boxes and not directly on the medication tubes.   If your medication is too expensive, please contact our office at [phone] option 4 or send us a message through MyChart.   We are unable to tell what your co-pay for medications will be in advance as this is different depending on your insurance coverage. However, we may be able to find a  substitute medication at lower cost or fill out paperwork to get insurance to cover a needed medication.   If a prior authorization is required to get your medication covered by your insurance company, please allow us 1-2 business days to complete this process.  Drug prices often vary depending on where the prescription is filled and some pharmacies may offer cheaper prices.  The website www.goodrx.com contains coupons for medications through different pharmacies. The prices here do not account for what the cost may be with help from insurance (it may be cheaper with your insurance), but the website can give you the price if you did not use any insurance.  - You can print the associated coupon and take it with your prescription to the pharmacy.  - You may also stop by our office during regular business hours and pick up a GoodRx coupon card.  - If you need your prescription sent electronically to a different pharmacy, notify our office through [facility] MyChart or by phone at [phone] option 4.     Wound Care Instructions  1. Cleanse wound gently with soap and water once a day then pat dry with clean gauze. Apply a thing coat of Petrolatum (petroleum jelly, "Vaseline") over the wound (unless you have an allergy to this). We recommend that you use a new, sterile tube of Vaseline. Do not pick or remove scabs. Do not remove the yellow or white "healing tissue" from the base of the wound.    2. Cover the wound with fresh, clean, nonstick gauze and secure with paper tape. You may use Band-Aids in place of gauze and tape if the would is small enough, but would recommend trimming much of the tape off as there is often too much. Sometimes Band-Aids can irritate the skin.  3. You should call the office for your biopsy report after 1 week if you have not already been contacted.  4. If you experience any problems, such as abnormal amounts of bleeding, swelling, significant bruising, significant pain,  or evidence of infection, please call the office immediately.  5. FOR ADULT SURGERY PATIENTS: If you need something for pain relief you may take 1 extra strength Tylenol (acetaminophen) AND 2 Ibuprofen (200mg each) together every 4 hours as needed for pain. (do not take these if you are allergic to them or if you have a reason you should not take them.) Typically, you may only need pain medication for 1 to 3 days.     

## 2020-07-24 NOTE — Progress Notes (Signed)
   Follow-Up Visit   Subjective  Duane Rodriguez is a 50 y.o. male who presents for the following: Cyst (R post neck, pr presents for excision).  The following portions of the chart were reviewed this encounter and updated as appropriate:   Tobacco  Allergies  Meds  Problems  Med Hx  Surg Hx  Fam Hx     Review of Systems:  No other skin or systemic complaints except as noted in HPI or Assessment and Plan.  Objective  Well appearing patient in no apparent distress; mood and affect are within normal limits.  A focused examination was performed including neck. Relevant physical exam findings are noted in the Assessment and Plan.  Objective  R post neck: Cystic pap 1.2cm   Assessment & Plan  Neoplasm of skin R post neck  Skin excision  Lesion length (cm):  1.2 Lesion width (cm):  1.2 Margin per side (cm):  0 Total excision diameter (cm):  1.2 Informed consent: discussed and consent obtained   Timeout: patient name, date of birth, surgical site, and procedure verified   Procedure prep:  Patient was prepped and draped in usual sterile fashion Prep type:  Isopropyl alcohol and povidone-iodine Anesthesia: the lesion was anesthetized in a standard fashion   Anesthesia comment:  6cc Anesthetic:  1% lidocaine w/ epinephrine 1-100,000 buffered w/ 8.4% NaHCO3 Instrument used comment:  #15c blade Hemostasis achieved with: pressure   Hemostasis achieved with comment:  Electrocautery Outcome: patient tolerated procedure well with no complications   Post-procedure details: sterile dressing applied and wound care instructions given   Dressing type: pressure dressing and bacitracin (Mupirocin)    Skin repair Complexity:  Complex Final length (cm):  1.5 Reason for type of repair: reduce tension to allow closure, reduce the risk of dehiscence, infection, and necrosis, reduce subcutaneous dead space and avoid a hematoma, allow closure of the large defect, preserve normal anatomy,  preserve normal anatomical and functional relationships and enhance both functionality and cosmetic results   Undermining: area extensively undermined   Undermining comment:  Undermining Defect 1.2cm Subcutaneous layers (deep stitches):  Suture size:  4-0 Suture type: Vicryl (polyglactin 910)   Subcutaneous suture technique: Inverted Dermal. Fine/surface layer approximation (top stitches):  Suture size:  4-0 Suture type: nylon   Stitches: simple running   Suture removal (days):  7 Hemostasis achieved with: pressure Outcome: patient tolerated procedure well with no complications   Post-procedure details: sterile dressing applied and wound care instructions given   Dressing type: bandage, pressure dressing and bacitracin (Mupirocin)    mupirocin ointment (BACTROBAN) 2 %  Specimen 1 - Surgical pathology Differential Diagnosis: D48.5 Cyst vs other  Check Margins: No Cystic pap 1.2cm  Cyst vs other  Start Mupirocin oint qd to excision site  Return in about 1 week (around 07/31/2020) for suture removal.  I, Othelia Pulling, RMA, am acting as scribe for Sarina Ser, MD .  Documentation: I have reviewed the above documentation for accuracy and completeness, and I agree with the above.  Sarina Ser, MD

## 2020-07-30 ENCOUNTER — Encounter: Payer: Self-pay | Admitting: Dermatology

## 2020-07-30 ENCOUNTER — Other Ambulatory Visit: Payer: Self-pay

## 2020-07-30 ENCOUNTER — Ambulatory Visit (INDEPENDENT_AMBULATORY_CARE_PROVIDER_SITE_OTHER): Payer: BC Managed Care – PPO | Admitting: Dermatology

## 2020-07-30 DIAGNOSIS — Z4802 Encounter for removal of sutures: Secondary | ICD-10-CM

## 2020-07-30 DIAGNOSIS — D485 Neoplasm of uncertain behavior of skin: Secondary | ICD-10-CM

## 2020-07-30 NOTE — Patient Instructions (Signed)

## 2020-07-30 NOTE — Progress Notes (Signed)
   Follow-Up Visit   Subjective  Duane Rodriguez is a 50 y.o. male who presents for the following: suture removal (Of the R post neck - patient is here today for suture removal, we are still awaiting pathology results).  The following portions of the chart were reviewed this encounter and updated as appropriate:   Tobacco  Allergies  Meds  Problems  Med Hx  Surg Hx  Fam Hx     Review of Systems:  No other skin or systemic complaints except as noted in HPI or Assessment and Plan.  Objective  Well appearing patient in no apparent distress; mood and affect are within normal limits.  A focused examination was performed including the neck. Relevant physical exam findings are noted in the Assessment and Plan.  Objective  R post neck: Healing excision site.   Assessment & Plan  Neoplasm of uncertain behavior of skin - likely Cyst R post neck  Encounter for Removal of Sutures - Incision site at the R post neck is clean, dry and intact - Wound cleansed, sutures removed, wound cleansed and steri strips applied.  - Will contact patient with pathology results.   - Patient advised to keep steri-strips dry until they fall off. - Scars remodel for a full year. - Once steri-strips fall off, patient can apply over-the-counter silicone scar cream each night to help with scar remodeling if desired. - Patient advised to call with any concerns or if they notice any new or changing lesions.   Return if symptoms worsen or fail to improve.  [name], CMA, am acting as scribe for Sarina Ser, MD .  Documentation: I have reviewed the above documentation for accuracy and completeness, and I agree with the above.  Sarina Ser, MD

## 2020-08-07 ENCOUNTER — Encounter: Payer: Self-pay | Admitting: Dermatology

## 2020-08-14 ENCOUNTER — Telehealth: Payer: Self-pay

## 2020-08-14 NOTE — Telephone Encounter (Signed)
-----   Message from Ralene Bathe, MD sent at 08/09/2020  4:00 PM EDT ----- 07/24/20 Posterior neck Benign cyst

## 2020-08-14 NOTE — Telephone Encounter (Signed)
Advised pt of bx results/sh ?

## 2021-04-06 ENCOUNTER — Encounter: Payer: Self-pay | Admitting: Emergency Medicine

## 2021-04-06 ENCOUNTER — Other Ambulatory Visit: Payer: Self-pay

## 2021-04-06 ENCOUNTER — Emergency Department
Admission: EM | Admit: 2021-04-06 | Discharge: 2021-04-07 | Disposition: A | Discharge status: Home / Self Care | Payer: BC Managed Care – PPO | Attending: Emergency Medicine | Admitting: Emergency Medicine

## 2021-04-06 ENCOUNTER — Emergency Department: Payer: BC Managed Care – PPO

## 2021-04-06 DIAGNOSIS — I10 Essential (primary) hypertension: Secondary | ICD-10-CM | POA: Insufficient documentation

## 2021-04-06 DIAGNOSIS — R29810 Facial weakness: Secondary | ICD-10-CM | POA: Diagnosis present

## 2021-04-06 DIAGNOSIS — G510 Bell's palsy: Secondary | ICD-10-CM | POA: Diagnosis not present

## 2021-04-06 DIAGNOSIS — R791 Abnormal coagulation profile: Secondary | ICD-10-CM | POA: Insufficient documentation

## 2021-04-06 LAB — CBC WITH DIFFERENTIAL/PLATELET
Abs Immature Granulocytes: 0.15 10*3/uL — ABNORMAL HIGH (ref 0.00–0.07)
Basophils Absolute: 0.1 10*3/uL (ref 0.0–0.1)
Basophils Relative: 1 %
Eosinophils Absolute: 0.4 10*3/uL (ref 0.0–0.5)
Eosinophils Relative: 4 %
HCT: 47 % (ref 39.0–52.0)
Hemoglobin: 15.8 g/dL (ref 13.0–17.0)
Immature Granulocytes: 1 %
Lymphocytes Relative: 19 %
Lymphs Abs: 2.2 10*3/uL (ref 0.7–4.0)
MCH: 30 pg (ref 26.0–34.0)
MCHC: 33.6 g/dL (ref 30.0–36.0)
MCV: 89.2 fL (ref 80.0–100.0)
Monocytes Absolute: 1.1 10*3/uL — ABNORMAL HIGH (ref 0.1–1.0)
Monocytes Relative: 10 %
Neutro Abs: 7.6 10*3/uL (ref 1.7–7.7)
Neutrophils Relative %: 65 %
Platelets: 337 10*3/uL (ref 150–400)
RBC: 5.27 MIL/uL (ref 4.22–5.81)
RDW: 12.2 % (ref 11.5–15.5)
WBC: 11.6 10*3/uL — ABNORMAL HIGH (ref 4.0–10.5)
nRBC: 0 % (ref 0.0–0.2)

## 2021-04-06 LAB — PROTIME-INR
INR: 0.9 (ref 0.8–1.2)
Prothrombin Time: 12.1 seconds (ref 11.4–15.2)

## 2021-04-06 LAB — COMPREHENSIVE METABOLIC PANEL
ALT: 64 U/L — ABNORMAL HIGH (ref 0–44)
AST: 33 U/L (ref 15–41)
Albumin: 3.8 g/dL (ref 3.5–5.0)
Alkaline Phosphatase: 114 U/L (ref 38–126)
Anion gap: 9 (ref 5–15)
BUN: 18 mg/dL (ref 6–20)
CO2: 29 mmol/L (ref 22–32)
Calcium: 9 mg/dL (ref 8.9–10.3)
Chloride: 102 mmol/L (ref 98–111)
Creatinine, Ser: 1.12 mg/dL (ref 0.61–1.24)
GFR, Estimated: >60 mL/min (ref >60)
Glucose, Bld: 94 mg/dL (ref 70–99)
Potassium: 4.5 mmol/L (ref 3.5–5.1)
Sodium: 140 mmol/L (ref 135–145)
Total Bilirubin: 0.7 mg/dL (ref 0.3–1.2)
Total Protein: 7.1 g/dL (ref 6.5–8.1)

## 2021-04-06 LAB — APTT: aPTT: 24 seconds (ref 24–36)

## 2021-04-06 NOTE — ED Triage Notes (Signed)
Pt via POV from home. Pt c/o R sided facial numbness that started last night around 6:00pm. Also noticed so swelling in the R jaw. Pt states that he felt a throbbing pain in his R ear. States that they noticed today a R sided facial droop. Denies hx of strokes. Pt has been sick with bronchitis recently. Pt is A&OX4 and NAD.

## 2021-04-06 NOTE — ED Provider Triage Note (Signed)
Emergency Medicine Provider Triage Evaluation Note  Duane Rodriguez , a 51 y.o. male  was evaluated in triage.  Pt complains of right-sided facial numbness, facial droop starting last night roughly at 6 PM.  Symptoms have been ongoing x24 hours.  Patient states that he started with some throbbing in his right ear, does have a slight right-sided headache.  Patient states the numbness in his face started yesterday and they noticed the droop this morning.  No history of previous CVAs.  Recent viral illness.  Patient has no weakness of the upper or lower extremities.  No difficulty formulating thoughts or words.  No slurred speech.  Review of Systems  Positive: Right-sided facial numbness and facial droop Negative: Right upper or lower extremity weakness, slurred speech, fevers, chills, chest pain, shortness of breath  Physical Exam  BP (!) 146/102 (BP Location: Right Arm)    Pulse 84    Temp 98.4 F (36.9 C) (Oral)    Resp 18    Ht 5\' 11"  (1.803 m)    Wt 96.2 kg    SpO2 99%    BMI 29.57 kg/m  Gen:   Awake, no distress   Resp:  Normal effort  MSK:   Moves extremities without difficulty  Other:  Patient with deficits in facial nerve following cranial nerve testing, no other acute deficit with testing on cranial nerves  Medical Decision Making  Medically screening exam initiated at 5:33 PM.  Appropriate orders placed.  Duane Rodriguez was informed that the remainder of the evaluation will be completed by another provider, this initial triage assessment does not replace that evaluation, and the importance of remaining in the ED until their evaluation is complete.  Patient with right-sided facial numbness, facial droop since last night.  Patient will have labs, CT scan at this time.   Darletta Moll, PA-C 04/06/21 1737

## 2021-04-07 ENCOUNTER — Emergency Department: Payer: BC Managed Care – PPO

## 2021-04-07 MED ORDER — VALACYCLOVIR HCL 1 G PO TABS
500.0000 mg | ORAL_TABLET | Freq: Three times a day (TID) | ORAL | 0 refills | Status: DC
Start: 2021-04-07 — End: 2021-04-07

## 2021-04-07 MED ORDER — ARTIFICIAL TEARS OPHTHALMIC OINT: TOPICAL_OINTMENT | Freq: Every evening | OPHTHALMIC | 1 refills | Status: AC | PRN

## 2021-04-07 MED ORDER — PREDNISONE 10 MG PO TABS
ORAL_TABLET | ORAL | 0 refills | Status: AC
Start: 2021-04-07 — End: ?

## 2021-04-07 MED ORDER — ARTIFICIAL TEARS OPHTHALMIC OINT
TOPICAL_OINTMENT | Freq: Once | OPHTHALMIC | Status: AC
  Filled 2021-04-07: qty 3.5

## 2021-04-07 MED ORDER — PREDNISONE 20 MG PO TABS
60.0000 mg | ORAL_TABLET | Freq: Once | ORAL | Status: AC
  Administered 2021-04-07: 60 mg via ORAL
  Filled 2021-04-07: qty 3

## 2021-04-07 MED ORDER — VALACYCLOVIR HCL 1 G PO TABS: 500.0000 mg | ORAL_TABLET | Freq: Three times a day (TID) | ORAL | 0 refills | Status: AC

## 2021-04-07 MED ORDER — ARTIFICIAL TEARS OPHTHALMIC OINT: TOPICAL_OINTMENT | Freq: Every evening | OPHTHALMIC | 1 refills | Status: DC | PRN

## 2021-04-07 MED ORDER — VALACYCLOVIR HCL 500 MG PO TABS
1000.0000 mg | ORAL_TABLET | Freq: Once | ORAL | Status: AC
  Administered 2021-04-07: 1000 mg via ORAL
  Filled 2021-04-07: qty 2

## 2021-04-07 MED ORDER — IOHEXOL 350 MG/ML SOLN
75.0000 mL | Freq: Once | INTRAVENOUS | Status: AC | PRN
  Administered 2021-04-07: 75 mL via INTRAVENOUS
  Filled 2021-04-07: qty 75

## 2021-04-07 MED ORDER — PREDNISONE 10 MG PO TABS: ORAL_TABLET | ORAL | 0 refills | Status: DC

## 2021-04-07 NOTE — Discharge Instructions (Signed)
You were diagnosed with Bell's palsy.  Take prednisone and valacyclovir as prescribed.  Make sure to apply artificial drops in your affected eye every hour while you are awake.  Before bedtime put the ointment in your affected eye and tape the eye shut.  Then place an eye patch over it to protect your cornea from abrasions.  You will have to do this until your Bell's palsy is resolved.  It may take several weeks for this to resolve.  Follow-up with your doctor in a week.  Return to the emergency room for slurred speech, severe headache, changes in vision, rash on your face, unilateral weakness or numbness, difficulty walking.

## 2021-04-07 NOTE — ED Provider Notes (Signed)
[facility] Provider Note    Event Date/Time   First MD Initiated Contact with Patient 04/06/21 2350     (approximate)   History   Facial Droop   HPI  Duane Rodriguez is a 51 y.o. male with no significant past medical history who presents for evaluation of right-sided facial numbness and weakness.  Patient reports that his symptoms started yesterday evening.  He first noticed that the things that he were eating and drinking did not taste the same.  Then he started having a pulsatile throbbing sensation in his right ear and this morning noticed a right-sided facial droop, difficulty to fully close his right eye.  Patient is currently on amoxicillin for bronchitis.  Has had a upper respiratory infection.  Denies any prior history of Bell's palsy or stroke.  Denies any slurred speech, gait instability, right-sided weakness or numbness of his extremities.     Past Medical History:  Diagnosis Date   Diverticulitis    Hypertension    patient denies, BP was elevated when he had pneumonia   Pneumonia 2015   Syncope and collapse 12/2017   syncopal episode x 1, wore a holter monitor with no findings, no episodes since    Past Surgical History:  Procedure Laterality Date   KNEE ARTHROSCOPY WITH MEDIAL MENISECTOMY Right 03/18/2018   Procedure: KNEE ARTHROSCOPY WITH MEDIAL MENISCAL REPAIR;  Surgeon: Leim Fabry, MD;  Location: [facility];  Service: Orthopedics;  Laterality: Right;   NASAL SINUS SURGERY     TOE SURGERY       Physical Exam   Triage Vital Signs: ED Triage Vitals  Enc Vitals Group     BP 04/06/21 1728 (!) 146/102     Pulse Rate 04/06/21 1728 84     Resp 04/06/21 1728 18     Temp 04/06/21 1728 98.4 F (36.9 C)     Temp Source 04/06/21 1728 Oral     SpO2 04/06/21 1728 99 %     Weight 04/06/21 1729 212 lb (96.2 kg)     Height 04/06/21 1729 5\' 11"  (1.803 m)     Head Circumference --      Peak Flow --      Pain Score 04/06/21 1729 4      Pain Loc --      Pain Edu? --      Excl. in [place]? --     Most recent vital signs: Vitals:   04/06/21 2115 04/07/21 0219  BP: (!) 136/95 (!) 134/100  Pulse: 91 67  Resp: 16 16  Temp:    SpO2: 98% 99%     Constitutional: Alert and oriented. Well appearing and in no apparent distress. HEENT:      Head: Normocephalic and atraumatic.         Eyes: Conjunctivae are normal. Sclera is non-icteric.       Mouth/Throat: Mucous membranes are moist.       Ears: Ear canals are clean with no rash      Neck: Supple with no signs of meningismus. Cardiovascular: Regular rate and rhythm. No murmurs, gallops, or rubs. 2+ symmetrical distal pulses are present in all extremities.  Respiratory: Normal respiratory effort. Lungs are clear to auscultation bilaterally.  Gastrointestinal: Soft, non tender, and non distended with positive bowel sounds. No rebound or guarding. Genitourinary: No CVA tenderness. Musculoskeletal:  No edema, cyanosis, or erythema of extremities. Neurologic: Normal speech and language.  Right-sided facial paralysis including the forehead and inability  to fully close the right eyelid, normal strength and sensation x4, no pronator drift, no dysmetria, normal gait.   Skin: Skin is warm, dry and intact. No rash noted. Psychiatric: Mood and affect are normal. Speech and behavior are normal.  ED Results / Procedures / Treatments   Labs (all labs ordered are listed, but only abnormal results are displayed) Labs Reviewed  COMPREHENSIVE METABOLIC PANEL - Abnormal; Notable for the following components:      Result Value   ALT 64 (*)    All other components within normal limits  CBC WITH DIFFERENTIAL/PLATELET - Abnormal; Notable for the following components:   WBC 11.6 (*)    Monocytes Absolute 1.1 (*)    Abs Immature Granulocytes 0.15 (*)    All other components within normal limits  PROTIME-INR  APTT     EKG  none   RADIOLOGY I, Rudene Re, attending MD, have  personally viewed and interpreted the images obtained during this visit as below:  CT of the head and neck with no acute abnormalities.   ___________________________________________________ Interpretation by Radiologist:  CT HEAD [place] (5MM)  Result Date: 04/06/2021 CLINICAL DATA:  Neuro deficit, acute, stroke suspected Facial droop, symptoms for 24 hours EXAM: CT HEAD WITHOUT CONTRAST TECHNIQUE: Contiguous axial images were obtained from the base of the skull through the vertex without intravenous contrast. RADIATION DOSE REDUCTION: This exam was performed according to the departmental dose-optimization program which includes automated exposure control, adjustment of the mA and/or kV according to patient size and/or use of iterative reconstruction technique. COMPARISON:  None. FINDINGS: Brain: No acute intracranial abnormality. Specifically, no hemorrhage, hydrocephalus, mass lesion, acute infarction, or significant intracranial injury. Vascular: No hyperdense vessel or unexpected calcification. Skull: No acute calvarial abnormality. Sinuses/Orbits: No acute findings Other: None IMPRESSION: Normal study. Electronically Signed   By: Rolm Baptise M.D.   On: 04/06/2021 18:54   CT Angio Neck W and/or Wo Contrast  Result Date: 04/07/2021 CLINICAL DATA:  Initial evaluation for carotid artery dissection. EXAM: CT ANGIOGRAPHY NECK TECHNIQUE: Multidetector CT imaging of the neck was performed using the standard protocol during bolus administration of intravenous contrast. Multiplanar CT image reconstructions and MIPs were obtained to evaluate the vascular anatomy. Carotid stenosis measurements (when applicable) are obtained utilizing NASCET criteria, using the distal internal carotid diameter as the denominator. RADIATION DOSE REDUCTION: This exam was performed according to the departmental dose-optimization program which includes automated exposure control, adjustment of the mA and/or kV according to  patient size and/or use of iterative reconstruction technique. CONTRAST:  67mL OMNIPAQUE IOHEXOL 350 MG/ML SOLN COMPARISON:  Prior MRI from 05/11/2004. FINDINGS: Aortic arch: Visualized aortic arch normal in caliber with normal 3 vessel morphology. Mild plaque within the arch itself. No stenosis or other abnormality about the origin the great vessels. Right carotid system: Right common and internal carotid arteries widely patent without stenosis, dissection or occlusion. Left carotid system: Left common and internal carotid arteries widely patent without stenosis, dissection or occlusion. Vertebral arteries: Both vertebral arteries arise from the subclavian arteries. No proximal subclavian artery stenosis. Both vertebral arteries widely patent without stenosis, dissection or occlusion. Skeleton: No discrete or worrisome osseous lesions. No significant spondylosis for age. Other neck: No other acute soft tissue abnormality within the neck. 4 mm right thyroid nodule noted, of doubtful significance given size and patient age, no follow-up imaging recommended (ref: J Am Coll Radiol. 2015 Feb;12(2): 143-50). Upper chest: Visualized upper chest demonstrates no acute finding. IMPRESSION: Normal CTA of the  neck. No evidence for dissection, hemodynamically significant stenosis, or other acute vascular abnormality. Electronically Signed   By: Jeannine Boga M.D.   On: 04/07/2021 01:47      PROCEDURES:  Critical Care performed: No  Procedures    IMPRESSION / MDM / ASSESSMENT AND PLAN / ED COURSE  I reviewed the triage vital signs and the nursing notes.  51 y.o. male with no significant past medical history who presents for evaluation of right-sided facial numbness and weakness.  Patient has right-sided facial paralysis including inability to fully close his right eye or elevate his forehead on the right.  No other neurological deficits, no rashes otherwise.  Is otherwise well-appearing.  Ddx: Bell's  palsy, cervical artery dissection, stroke   Plan: CT head and CTA of the neck   MEDICATIONS GIVEN IN ED: Medications  artificial tears (LACRILUBE) ophthalmic ointment ( Right Eye Given 04/07/21 0122)  iohexol (OMNIPAQUE) 350 MG/ML injection 75 mL (75 mLs Intravenous Contrast Given 04/07/21 0041)  predniSONE (DELTASONE) tablet 60 mg (60 mg Oral Given 04/07/21 0216)  valACYclovir (VALTREX) tablet 1,000 mg (1,000 mg Oral Given 04/07/21 0216)     ED COURSE: Presentation is most likely due to Bell's palsy however since patient did have a throbbing pulsatile ear pain before symptoms developed he was sent for a CT a of the neck which was negative for cervical artery dissection.  CT of the head was also negative for stroke.  Patient was started on valacyclovir and prednisone.  We discussed eye care with eye patch, artificial tears ointment at nighttime and artificial teardrops during the day.  Recommended close follow-up with primary care doctor and discussed my standard return precautions.  Admission was considered but felt unnecessary with the symptoms that can be treated at home especially since patient has a PCP.   Consults: None   EMR reviewed including visit with primary care doctor from a week ago       FINAL CLINICAL IMPRESSION(S) / ED DIAGNOSES   Final diagnoses:  Bell's palsy     Rx / DC Orders   ED Discharge Orders          Ordered    predniSONE (DELTASONE) 10 MG tablet  Status:  Discontinued        04/07/21 0205    valACYclovir (VALTREX) 1000 MG tablet  3 times daily,   Status:  Discontinued        04/07/21 0205    artificial tears (LACRILUBE) OINT ophthalmic ointment  At bedtime PRN,   Status:  Discontinued        04/07/21 0205    artificial tears (LACRILUBE) OINT ophthalmic ointment  At bedtime PRN,   Status:  Discontinued        04/07/21 0206    predniSONE (DELTASONE) 10 MG tablet        04/07/21 0206    valACYclovir (VALTREX) 1000 MG tablet  3 times daily         04/07/21 0206    artificial tears (LACRILUBE) OINT ophthalmic ointment  At bedtime PRN        04/07/21 0206             Note:  This document was prepared using Dragon voice recognition software and may include unintentional dictation errors.   Alfred Levins, [place], MD 04/07/21 [phone]

## 2022-06-24 ENCOUNTER — Other Ambulatory Visit (HOSPITAL_COMMUNITY): Payer: Self-pay | Admitting: Physician Assistant

## 2022-06-24 ENCOUNTER — Ambulatory Visit
Admission: RE | Admit: 2022-06-24 | Discharge: 2022-06-24 | Disposition: A | Discharge status: Home / Self Care | Payer: BC Managed Care – PPO | Source: Ambulatory Visit | Attending: Physician Assistant | Admitting: Physician Assistant

## 2022-06-24 DIAGNOSIS — R2242 Localized swelling, mass and lump, left lower limb: Secondary | ICD-10-CM | POA: Diagnosis present

## 2022-06-24 DIAGNOSIS — S86812A Strain of other muscle(s) and tendon(s) at lower leg level, left leg, initial encounter: Secondary | ICD-10-CM

## 2022-12-10 ENCOUNTER — Ambulatory Visit: Payer: BC Managed Care – PPO | Admitting: Dermatology

## 2023-02-22 ENCOUNTER — Encounter: Payer: Self-pay | Admitting: *Deleted

## 2023-02-22 ENCOUNTER — Ambulatory Visit
Admission: EM | Admit: 2023-02-22 | Discharge: 2023-02-22 | Disposition: A | Discharge status: Home / Self Care | Payer: BC Managed Care – PPO | Attending: Emergency Medicine | Admitting: Emergency Medicine

## 2023-02-22 DIAGNOSIS — J069 Acute upper respiratory infection, unspecified: Secondary | ICD-10-CM | POA: Diagnosis not present

## 2023-02-22 LAB — POCT INFLUENZA A/B
Influenza A, POC: NEGATIVE
Influenza B, POC: NEGATIVE

## 2023-02-22 MED ORDER — PROMETHAZINE-DM 6.25-15 MG/5ML PO SYRP: 5.0000 mL | ORAL_SOLUTION | Freq: Every evening | ORAL | 0 refills | Status: AC | PRN

## 2023-02-22 MED ORDER — BENZONATATE 100 MG PO CAPS: 100.0000 mg | ORAL_CAPSULE | Freq: Three times a day (TID) | ORAL | 0 refills | Status: AC

## 2023-02-22 NOTE — ED Triage Notes (Signed)
Patient states cough/congestion and fever for 2 days tmax 100.4 this morning, home covid test negative on yesterday.  Taking OTC Sudafed with some relief

## 2023-02-22 NOTE — Discharge Instructions (Addendum)
Your symptoms today are most likely being caused by a virus and should steadily improve in time it can take up to 7 to 10 days before you truly start to see a turnaround however things will get better  Flu and Strep testing negative  You may use Tessalon pill every 8 hours to help calm your coughing,  May use cough syrup at bedtime to allow for rest, may take these in addition to over-the-counter medications    You can take Tylenol and/or Ibuprofen as needed for fever reduction and pain relief.   For cough: honey 1/2 to 1 teaspoon (you can dilute the honey in water or another fluid).  You can also use guaifenesin and dextromethorphan for cough. You can use a humidifier for chest congestion and cough.  If you don't have a humidifier, you can sit in the bathroom with the hot shower running.      For sore throat: try warm salt water gargles, cepacol lozenges, throat spray, warm tea or water with lemon/honey, popsicles or ice, or OTC cold relief medicine for throat discomfort.   For congestion: take a daily anti-histamine like Zyrtec, Claritin, and a oral decongestant, such as pseudoephedrine.  You can also use Flonase 1-2 sprays in each nostril daily.   It is important to stay hydrated: drink plenty of fluids (water, gatorade/powerade/pedialyte, juices, or teas) to keep your throat moisturized and help further relieve irritation/discomfort.

## 2023-02-25 NOTE — ED Provider Notes (Signed)
Renaldo Fiddler    CSN: [id number] Arrival date & time: 02/22/23  1146      History   Chief Complaint Chief Complaint  Patient presents with   Nasal Congestion   Fever    HPI KASMER SEEPERSAD is a 52 y.o. male.   Patient presents for evaluation of subjective fever, nasal congestion and a nonproductive cough present for 2 days.  Tolerating food and liquids.  Has attempted use of Sudafed, Tylenol and ibuprofen.  Home COVID test negative.  Denies shortness of breath, wheezing, sore throat or ear pain.  Past Medical History:  Diagnosis Date   Diverticulitis    Hypertension    patient denies, BP was elevated when he had pneumonia   Pneumonia 2015   Syncope and collapse 12/2017   syncopal episode x 1, wore a holter monitor with no findings, no episodes since    Patient Active Problem List   Diagnosis Date Noted   Tachycardia 03/31/2013   CAP (community acquired pneumonia) 03/31/2013   Abnormal chest x-ray 03/31/2013   ANKLE PAIN, BILATERAL 04/09/2009   Acute upper respiratory infection 12/29/2006    Past Surgical History:  Procedure Laterality Date   KNEE ARTHROSCOPY WITH MEDIAL MENISECTOMY Right 03/18/2018   Procedure: KNEE ARTHROSCOPY WITH MEDIAL MENISCAL REPAIR;  Surgeon: Signa Kell, MD;  Location: [facility];  Service: Orthopedics;  Laterality: Right;   NASAL SINUS SURGERY     TOE SURGERY         Home Medications    Prior to Admission medications   Medication Sig Start Date End Date Taking? Authorizing Provider  benzonatate (TESSALON) 100 MG capsule Take 1 capsule (100 mg total) by mouth every 8 (eight) hours. 02/22/23  Yes Shavette Shoaff, Elita Boone, NP  promethazine-dextromethorphan (PROMETHAZINE-DM) 6.25-15 MG/5ML syrup Take 5 mLs by mouth at bedtime as needed for cough. 02/22/23  Yes Courtny Bennison, Elita Boone, NP  artificial tears (LACRILUBE) OINT ophthalmic ointment Place into the right eye at bedtime as needed for dry eyes. 04/07/21   Nita Sickle, MD   HYDROcodone-acetaminophen [facility]) 5-325 MG tablet Take 1-2 tablets by mouth every 4 (four) hours as needed for moderate pain or severe pain. Patient not taking: No sig reported 03/18/18   Signa Kell, MD  loratadine (CLARITIN) 10 MG tablet Take 10 mg by mouth daily.    [provider]  mupirocin ointment (BACTROBAN) 2 % Apply 1 application topically daily. Qd to excision site 07/24/20   Deirdre Evener, MD  ondansetron (ZOFRAN ODT) 4 MG disintegrating tablet Take 1 tablet (4 mg total) by mouth every 8 (eight) hours as needed for nausea or vomiting. Patient not taking: No sig reported 03/18/18   Signa Kell, MD  predniSONE (DELTASONE) 10 MG tablet Take 60mg  for the next 4 days once a day Take 50mg  for one day Take 40mg  for one day Take 30mg  for one day Take 20mg  for one day Stop 04/07/21   Nita Sickle, MD    Family History Family History  Problem Relation Age of Onset   Hypertension Mother    Cancer - Lung Father     Social History Social History   Tobacco Use   Smoking status: Never   Smokeless tobacco: Never  Vaping Use   Vaping status: Never Used  Substance Use Topics   Alcohol use: Yes    Comment: rarely   Drug use: No     Allergies   Ibuprofen   Review of Systems Review of Systems  Constitutional:  Positive for fever.     Physical Exam Triage Vital Signs ED Triage Vitals  Encounter Vitals Group     BP 02/22/23 1334 128/87     Systolic BP Percentile --      Diastolic BP Percentile --      Pulse Rate 02/22/23 1334 91     Resp 02/22/23 1334 20     Temp 02/22/23 1334 99.1 F (37.3 C)     Temp Source 02/22/23 1334 Oral     SpO2 02/22/23 1334 97 %     Weight 02/22/23 1332 206 lb (93.4 kg)     Height 02/22/23 1332 5\' 11"  (1.803 m)     Head Circumference --      Peak Flow --      Pain Score 02/22/23 1332 7     Pain Loc --      Pain Education --      Exclude from Growth Chart --    No data found.  Updated Vital Signs BP 128/87 (BP  Location: Left Arm)   Pulse 91   Temp 99.1 F (37.3 C) (Oral)   Resp 20   Ht 5\' 11"  (1.803 m)   Wt 206 lb (93.4 kg)   SpO2 97%   BMI 28.73 kg/m   Visual Acuity Right Eye Distance:   Left Eye Distance:   Bilateral Distance:    Right Eye Near:   Left Eye Near:    Bilateral Near:     Physical Exam Constitutional:      Appearance: Normal appearance.  HENT:     Head: Normocephalic.     Right Ear: Tympanic membrane, ear canal and external ear normal.     Left Ear: Tympanic membrane, ear canal and external ear normal.     Nose: Congestion present. No rhinorrhea.     Mouth/Throat:     Pharynx: Posterior oropharyngeal erythema present. No oropharyngeal exudate.  Eyes:     Extraocular Movements: Extraocular movements intact.  Cardiovascular:     Rate and Rhythm: Normal rate and regular rhythm.     Pulses: Normal pulses.     Heart sounds: Normal heart sounds.  Pulmonary:     Effort: Pulmonary effort is normal.     Breath sounds: Normal breath sounds.  Musculoskeletal:     Cervical back: Normal range of motion and neck supple.  Neurological:     Mental Status: He is alert and oriented to person, place, and time. Mental status is at baseline.      UC Treatments / Results  Labs (all labs ordered are listed, but only abnormal results are displayed) Labs Reviewed  POCT RAPID STREP A (OFFICE)  POCT INFLUENZA A/B    EKG   Radiology No results found.  Procedures Procedures (including critical care time)  Medications Ordered in UC Medications - No data to display  Initial Impression / Assessment and Plan / UC Course  I have reviewed the triage vital signs and the nursing notes.  Pertinent labs & imaging results that were available during my care of the patient were reviewed by me and considered in my medical decision making (see chart for details).  Viral URI with cough  Patient is in no signs of distress nor toxic appearing.  Vital signs are stable.  Low  suspicion for pneumonia, pneumothorax or bronchitis and therefore will defer imaging.  COVID and flu testing negative.  Prescribed Tessalon and Promethazine DM.May use additional over-the-counter medications as needed for supportive care.  May follow-up  with urgent care as needed if symptoms persist or worsen.   Final Clinical Impressions(s) / UC Diagnoses   Final diagnoses:  Viral URI with cough     Discharge Instructions      Your symptoms today are most likely being caused by a virus and should steadily improve in time it can take up to 7 to 10 days before you truly start to see a turnaround however things will get better  Flu and Strep testing negative  You may use Tessalon pill every 8 hours to help calm your coughing,  May use cough syrup at bedtime to allow for rest, may take these in addition to over-the-counter medications    You can take Tylenol and/or Ibuprofen as needed for fever reduction and pain relief.   For cough: honey 1/2 to 1 teaspoon (you can dilute the honey in water or another fluid).  You can also use guaifenesin and dextromethorphan for cough. You can use a humidifier for chest congestion and cough.  If you don't have a humidifier, you can sit in the bathroom with the hot shower running.      For sore throat: try warm salt water gargles, cepacol lozenges, throat spray, warm tea or water with lemon/honey, popsicles or ice, or OTC cold relief medicine for throat discomfort.   For congestion: take a daily anti-histamine like Zyrtec, Claritin, and a oral decongestant, such as pseudoephedrine.  You can also use Flonase 1-2 sprays in each nostril daily.   It is important to stay hydrated: drink plenty of fluids (water, gatorade/powerade/pedialyte, juices, or teas) to keep your throat moisturized and help further relieve irritation/discomfort.    ED Prescriptions     Medication Sig Dispense Auth. Provider   benzonatate (TESSALON) 100 MG capsule Take 1 capsule (100  mg total) by mouth every 8 (eight) hours. 21 capsule Ruthy Forry R, NP   promethazine-dextromethorphan (PROMETHAZINE-DM) 6.25-15 MG/5ML syrup Take 5 mLs by mouth at bedtime as needed for cough. 118 mL Tyray Proch, Elita Boone, NP      PDMP not reviewed this encounter.   Valinda Hoar, [place] 02/25/23 [phone]

## 2023-04-15 ENCOUNTER — Ambulatory Visit: Payer: BC Managed Care – PPO | Admitting: Dermatology

## 2023-04-15 ENCOUNTER — Encounter: Payer: Self-pay | Admitting: Dermatology

## 2023-04-15 DIAGNOSIS — L821 Other seborrheic keratosis: Secondary | ICD-10-CM

## 2023-04-15 DIAGNOSIS — L720 Epidermal cyst: Secondary | ICD-10-CM | POA: Diagnosis not present

## 2023-04-15 DIAGNOSIS — L820 Inflamed seborrheic keratosis: Secondary | ICD-10-CM

## 2023-04-15 DIAGNOSIS — L578 Other skin changes due to chronic exposure to nonionizing radiation: Secondary | ICD-10-CM | POA: Diagnosis not present

## 2023-04-15 DIAGNOSIS — D2239 Melanocytic nevi of other parts of face: Secondary | ICD-10-CM | POA: Diagnosis not present

## 2023-04-15 DIAGNOSIS — D492 Neoplasm of unspecified behavior of bone, soft tissue, and skin: Secondary | ICD-10-CM

## 2023-04-15 DIAGNOSIS — W098XXA Fall on or from other playground equipment, initial encounter: Secondary | ICD-10-CM

## 2023-04-15 NOTE — Patient Instructions (Addendum)

## 2023-04-15 NOTE — Progress Notes (Signed)
   Follow-Up Visit   Subjective  Duane Rodriguez is a 53 y.o. male who presents for the following: Spots on face. Raised, scaly. Bothersome.  The patient has spots, moles and lesions to be evaluated, some may be new or changing and the patient may have concern these could be cancer.    The following portions of the chart were reviewed this encounter and updated as appropriate: medications, allergies, medical history  Review of Systems:  No other skin or systemic complaints except as noted in HPI or Assessment and Plan.  Objective  Well appearing patient in no apparent distress; mood and affect are within normal limits.  A focused examination was performed of the following areas: Scalp, face, ears, neck  Relevant physical exam findings are noted in the Assessment and Plan.  Left Root of Nose 4 mm pink flesh papule  Left Upper Eyelid x2 (2) waxy pedunculated papules x 2  Assessment & Plan   NEOPLASM OF SKIN Left Root of Nose Epidermal / dermal shaving  Lesion diameter (cm):  0.4 Informed consent: discussed and consent obtained   Patient was prepped and draped in usual sterile fashion: Area prepped with alcohol. Anesthesia: the lesion was anesthetized in a standard fashion   Anesthetic:  1% lidocaine w/ epinephrine 1-100,000 buffered w/ 8.4% NaHCO3 Instrument used: flexible razor blade   Hemostasis achieved with: pressure, aluminum chloride and electrodesiccation   Outcome: patient tolerated procedure well   Post-procedure details: wound care instructions given   Post-procedure details comment:  Ointment and small bandage applied Specimen 1 - Surgical pathology Differential Diagnosis: Irritated nevus vs other  Check Margins: No INFLAMED SEBORRHEIC KERATOSIS (2) Left Upper Eyelid x2 (2) Symptomatic, irritating, patient would like treated. Destruction of lesion - Left Upper Eyelid x2 (2)  Destruction method: cryotherapy   Destruction method comment:  Snip removal x  2 (not cryotherapy) Informed consent: discussed and consent obtained   Anesthesia: the lesion was anesthetized in a standard fashion   Anesthetic:  1% lidocaine w/ epinephrine 1-100,000 buffered w/ 8.4% NaHCO3 Hemostasis achieved with:  electrodesiccation Outcome: patient tolerated procedure well with no complications   Post-procedure details comment:  Mupirocin ointment applied  Milia - tiny firm white papule at right upper eyelid at margin - type of cyst - benign - sometimes these will clear with nightly OTC adapalene/Differin 0.1% gel or retinol. - may be extracted if symptomatic - observe - Advised patient to ask his ophthalmologist if he can remove it.   SEBORRHEIC KERATOSIS - Stuck-on, waxy, tan-brown papules face   - Benign-appearing - Discussed benign etiology and prognosis. - Observe - Call for any changes  ACTINIC DAMAGE - chronic, secondary to cumulative UV radiation exposure/sun exposure over time - diffuse scaly erythematous macules with underlying dyspigmentation - Recommend daily broad spectrum sunscreen SPF 30+ to sun-exposed areas, reapply every 2 hours as needed.  - Recommend staying in the shade or wearing long sleeves, sun glasses (UVA+UVB protection) and wide brim hats (4-inch brim around the entire circumference of the hat). - Call for new or changing lesions.   Return if symptoms worsen or fail to improve.  I, Lawson Radar, CMA, am acting as scribe for Willeen Niece, MD.   Documentation: I have reviewed the above documentation for accuracy and completeness, and I agree with the above.  Willeen Niece, MD

## 2023-04-17 LAB — SURGICAL PATHOLOGY

## 2023-04-20 ENCOUNTER — Telehealth: Payer: Self-pay

## 2023-04-20 NOTE — Telephone Encounter (Signed)
-----   Message from Willeen Niece sent at 04/20/2023 10:07 AM EST ----- 1. Skin, left root of nose :       MELANOCYTIC NEVUS, INTRADERMAL TYPE   Benign mole - please call patient

## 2023-04-20 NOTE — Telephone Encounter (Signed)
 Patient informed of pathology results

## 2023-04-20 NOTE — Telephone Encounter (Signed)
 Lft pt msg to call for bx results/sh
